# Patient Record
Sex: Female | Born: 1970 | Race: White | Hispanic: No | Marital: Married | Smoking: Never smoker
Health system: Southern US, Community
[De-identification: ages and names within clinical notes are randomized; demographics above are authoritative.]

## PROBLEM LIST (undated history)

## (undated) DIAGNOSIS — E669 Obesity, unspecified: Secondary | ICD-10-CM

## (undated) DIAGNOSIS — E785 Hyperlipidemia, unspecified: Secondary | ICD-10-CM

## (undated) DIAGNOSIS — I10 Essential (primary) hypertension: Secondary | ICD-10-CM

## (undated) HISTORY — DX: Essential (primary) hypertension: I10

## (undated) HISTORY — DX: Hyperlipidemia, unspecified: E78.5

## (undated) HISTORY — PX: OTHER SURGICAL HISTORY: SHX169

## (undated) HISTORY — DX: Obesity, unspecified: E66.9

---

## 1998-06-04 ENCOUNTER — Other Ambulatory Visit: Admission: RE | Admit: 1998-06-04 | Discharge: 1998-06-04 | Payer: Self-pay | Admitting: Obstetrics and Gynecology

## 1998-11-10 ENCOUNTER — Inpatient Hospital Stay (HOSPITAL_COMMUNITY): Admission: AD | Admit: 1998-11-10 | Discharge: 1998-11-12 | Payer: Self-pay | Admitting: Obstetrics and Gynecology

## 1998-12-14 ENCOUNTER — Other Ambulatory Visit: Admission: RE | Admit: 1998-12-14 | Discharge: 1998-12-14 | Payer: Self-pay | Admitting: Obstetrics and Gynecology

## 2004-05-28 ENCOUNTER — Other Ambulatory Visit: Admission: RE | Admit: 2004-05-28 | Discharge: 2004-05-28 | Payer: Self-pay | Admitting: Obstetrics and Gynecology

## 2004-08-11 ENCOUNTER — Ambulatory Visit: Payer: Self-pay | Admitting: Cardiology

## 2004-09-30 ENCOUNTER — Encounter: Admission: RE | Admit: 2004-09-30 | Discharge: 2004-12-29 | Payer: Self-pay | Admitting: Cardiology

## 2005-01-10 ENCOUNTER — Ambulatory Visit: Payer: Self-pay | Admitting: Cardiology

## 2005-03-01 ENCOUNTER — Ambulatory Visit: Payer: Self-pay | Admitting: Cardiology

## 2005-06-20 ENCOUNTER — Other Ambulatory Visit: Admission: RE | Admit: 2005-06-20 | Discharge: 2005-06-20 | Payer: Self-pay | Admitting: Obstetrics and Gynecology

## 2006-11-02 ENCOUNTER — Ambulatory Visit: Payer: Self-pay | Admitting: Cardiology

## 2009-01-07 ENCOUNTER — Encounter: Payer: Self-pay | Admitting: Cardiology

## 2009-04-11 DIAGNOSIS — E669 Obesity, unspecified: Secondary | ICD-10-CM | POA: Insufficient documentation

## 2009-04-11 DIAGNOSIS — E785 Hyperlipidemia, unspecified: Secondary | ICD-10-CM | POA: Insufficient documentation

## 2009-04-20 ENCOUNTER — Encounter (INDEPENDENT_AMBULATORY_CARE_PROVIDER_SITE_OTHER): Payer: Self-pay | Admitting: *Deleted

## 2009-09-03 ENCOUNTER — Telehealth: Payer: Self-pay | Admitting: Cardiology

## 2009-09-23 DIAGNOSIS — I10 Essential (primary) hypertension: Secondary | ICD-10-CM | POA: Insufficient documentation

## 2009-10-02 ENCOUNTER — Encounter (INDEPENDENT_AMBULATORY_CARE_PROVIDER_SITE_OTHER): Payer: Self-pay | Admitting: *Deleted

## 2009-12-07 ENCOUNTER — Ambulatory Visit: Payer: Self-pay | Admitting: Cardiology

## 2010-02-15 ENCOUNTER — Ambulatory Visit: Payer: Self-pay | Admitting: Diagnostic Radiology

## 2010-02-15 ENCOUNTER — Emergency Department (HOSPITAL_BASED_OUTPATIENT_CLINIC_OR_DEPARTMENT_OTHER): Admission: EM | Admit: 2010-02-15 | Discharge: 2010-02-15 | Payer: Self-pay | Admitting: Emergency Medicine

## 2010-10-12 NOTE — Assessment & Plan Note (Signed)
Summary: ROV PER PT CALL/JSS      Allergies Added: NKDA  Visit Type:  rov Primary Provider:  Dr. Jannet Mantis   History of Present Illness: Danielle Newton returns today for evaluation and management of her hypertension, obesity, and mild hyperlipidemia.  Her weight has gone up a few pounds. The last time I saw her was in 2008. She is walking 3 hours a week with her children. She takes her medication reliably.  She's having no angina or ischemic symptoms. Denies any polyuria polydipsia or weight loss.  Problems Prior to Update: 1)  Hypertension  (ICD-401.9) 2)  Hyperlipidemia, Mild  (ICD-272.4) 3)  Obesity  (ICD-278.00)  Current Medications (verified): 1)  Diovan Hct 80-12.5 Mg Tabs (Valsartan-Hydrochlorothiazide) .Marland Kitchen.. 1 Tab Once Daily  Allergies (verified): No Known Drug Allergies  Past History:  Past Medical History: Last updated: 09/23/2009 HYPERTENSION (ICD-401.9) HYPERLIPIDEMIA, MILD (ICD-272.4) OBESITY (ICD-278.00)      Past Surgical History: Last updated: 04/11/2009 Vaginal delivery 1998/2000  Family History: Last updated: 04/11/2009 Family History of Hypertension: Father HTN  Social History: Last updated: 04/11/2009 Married  Drug Use - no  Review of Systems       negative other history of present illness  Vital Signs:  Patient profile:   40 year old female Height:      61 inches Weight:      209 pounds BMI:     39.63 Pulse rate:   85 / minute BP sitting:   141 / 86  (left arm) Cuff size:   large  Vitals Entered By: Danielle Newton (December 07, 2009 9:29 AM)  Physical Exam  General:  obese.   Head:  normocephalic and atraumatic Eyes:  PERRLA/EOM intact; conjunctiva and lids normal. Neck:  Neck supple, no JVD. No masses, thyromegaly or abnormal cervical nodes. Chest Jerome Otter:  no deformities or breast masses noted Lungs:  Clear bilaterally to auscultation and percussion. Heart:  Non-displaced PMI, chest non-tender; regular rate and rhythm, S1, S2  without murmurs, rubs or gallops. Carotid upstroke normal, no bruit. Normal abdominal aortic size, no bruits. Femorals normal pulses, no bruits. Pedals normal pulses. No edema, no varicosities. Msk:  Back normal, normal gait. Muscle strength and tone normal. Pulses:  pulses normal in all 4 extremities Extremities:  No clubbing or cyanosis. Neurologic:  Alert and oriented x 3.   Impression & Recommendations:  Problem # 1:  HYPERTENSION (ICD-401.9) Assessment Unchanged Her blood pressures are running 130/80. She is exercising on a regular basis. Have encouraged her to hold her weight stable order to lose. Otherwise no change. Her updated medication list for this problem includes:    Diovan Hct 80-12.5 Mg Tabs (Valsartan-hydrochlorothiazide) .Marland Kitchen... 1 tab once daily  Problem # 2:  OBESITY (ICD-278.00) Assessment: Deteriorated  Problem # 3:  HYPERLIPIDEMIA, MILD (ICD-272.4) Assessment: Unchanged Annual fasting lipids and blood sugar with Dr. Henderson Cloud.  Patient Instructions: 1)  Your physician recommends that you schedule a follow-up appointment in: YEAR WITH DR Warner Laduca 2)  Your physician recommends that you continue on your current medications as directed. Please refer to the Current Medication list given to you today.

## 2010-10-12 NOTE — Letter (Signed)
Summary: Appointment - Missed  [facility]     [place], [place]    Phone:   Fax:      October 02, 2009 MRN: [id number]   Danielle Newton [address] [place], [place]  [address]   Dear [name],  Our records indicate you missed your appointment on 10/02/2009 with  Dr. Daleen Squibb   It is very important that we reach you to reschedule this appointment. We look forward to participating in your health care needs. Please contact us at the number listed above at your earliest convenience to reschedule this appointment.     Sincerely,   Lorne Skeens  [facility] Scheduling Team

## 2010-10-28 IMAGING — CR DG CHEST 2V
2 series · 2 of 2 positions shown · non-contrast
Comparison: No prior studies.

CLINICAL DATA: Chest pain and shortness of breath.

CHEST - 2 VIEW

[w chest pa]
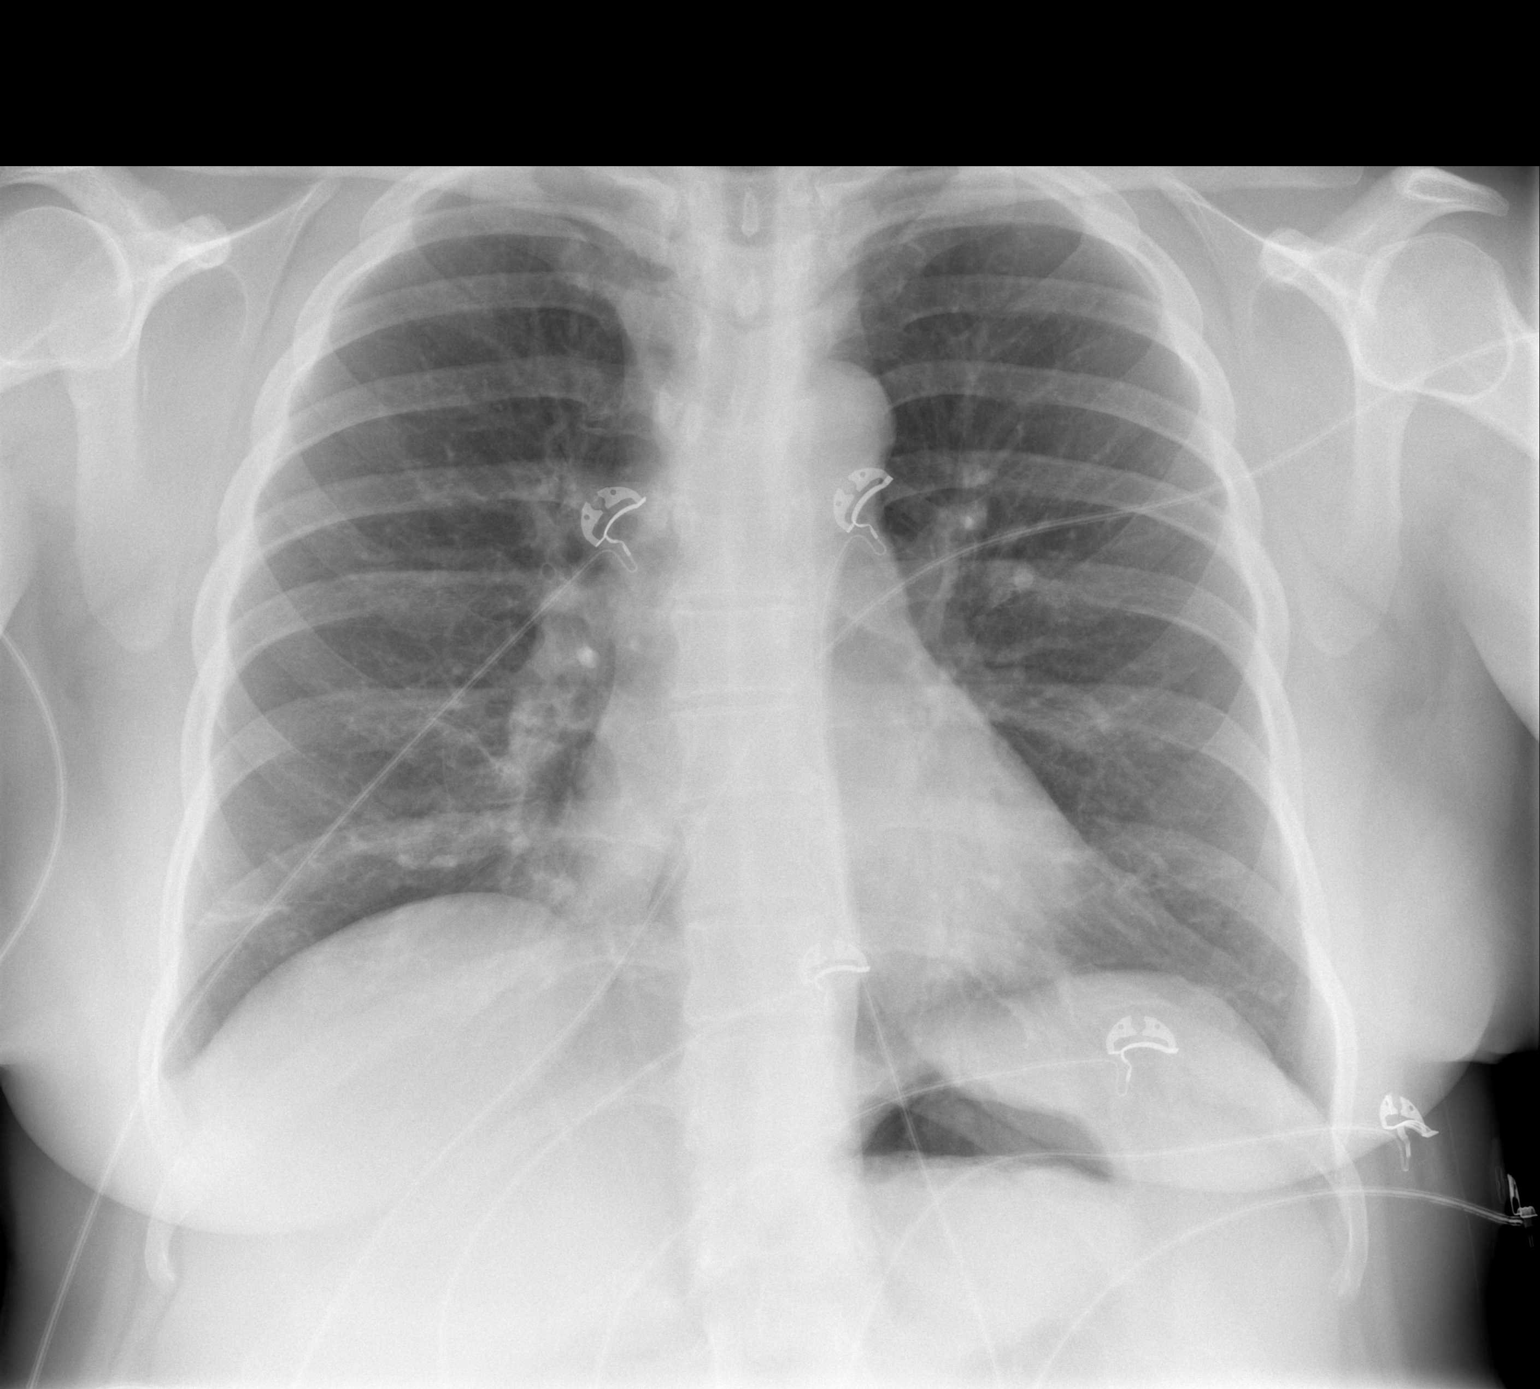

[w chest lat]
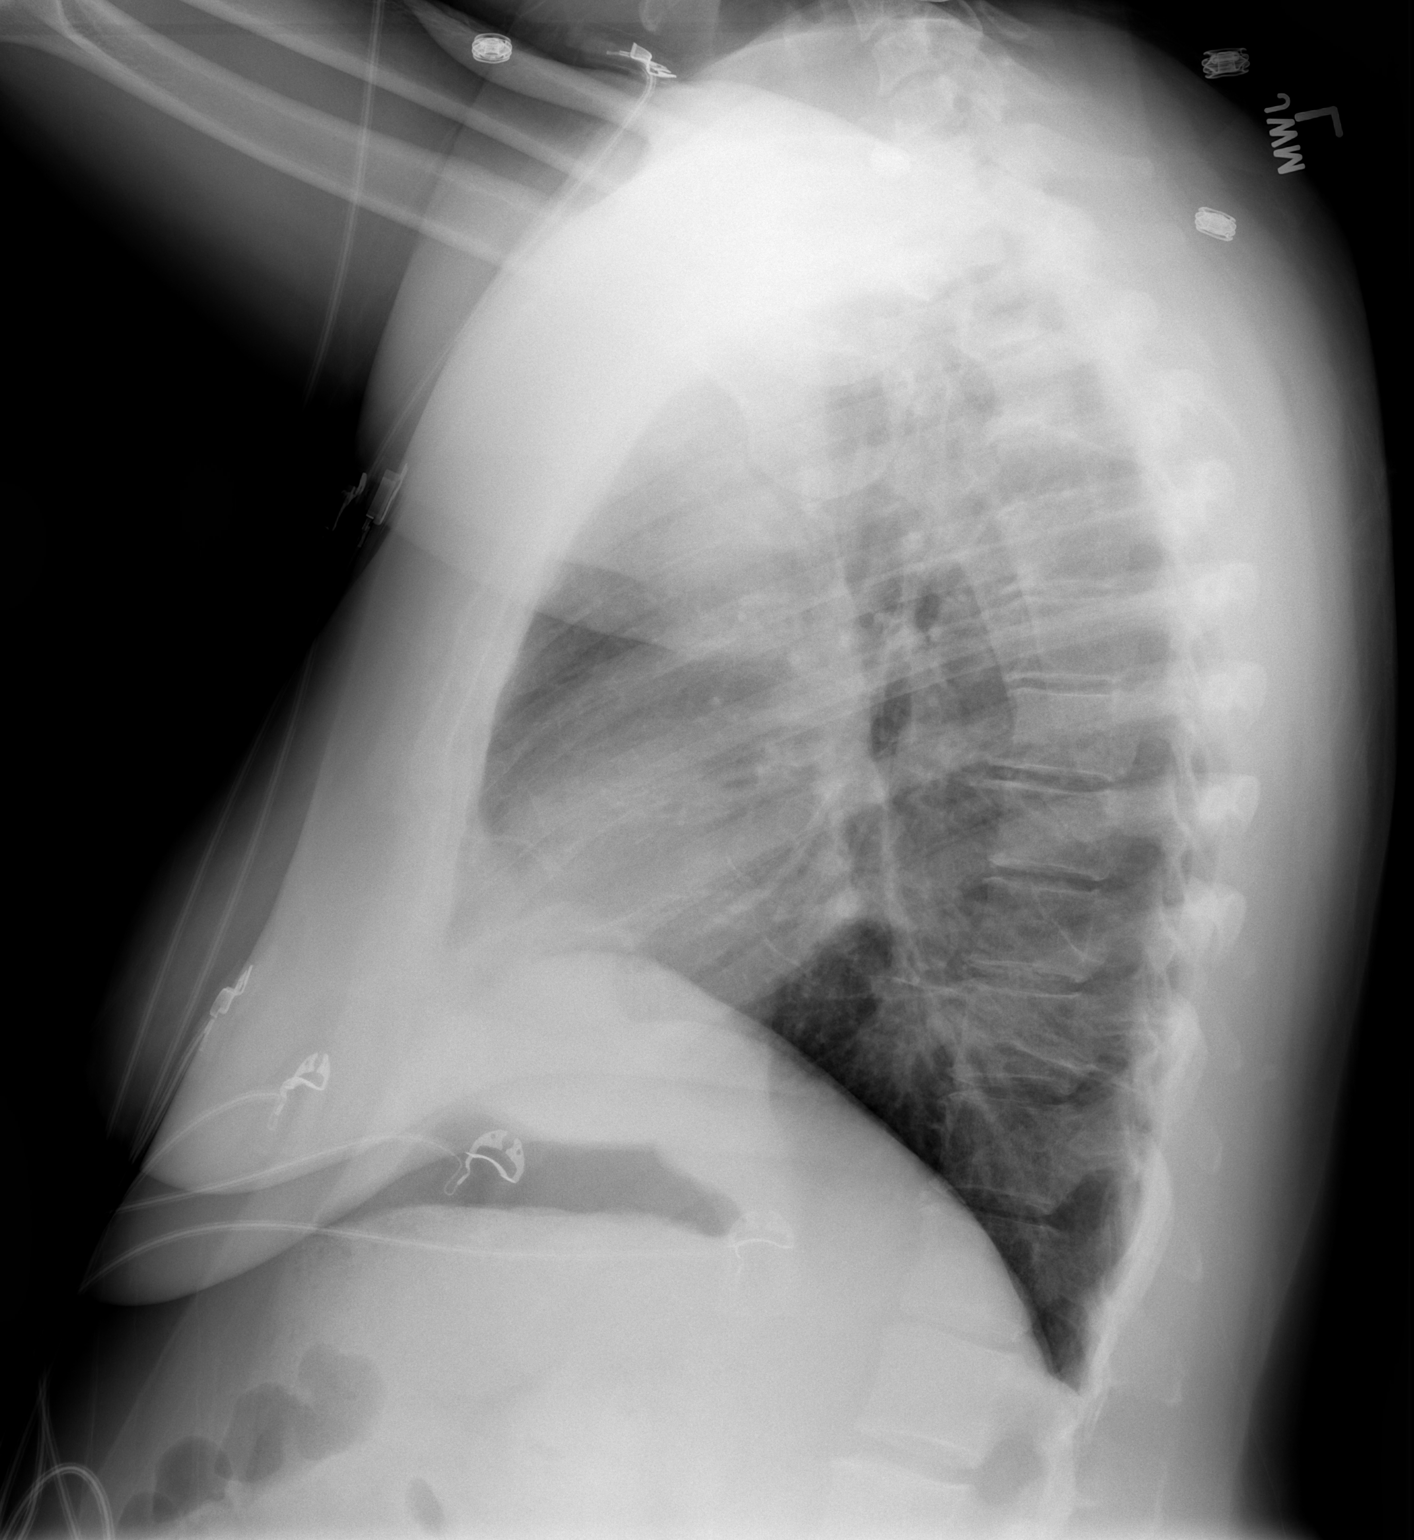

[2 of 2 positions shown; findings below may reference images not displayed]

FINDINGS: Minor linear atelectasis/scarring is noted with the
anterior portion of the right middle lobe and within the right
lower lobe.  There are no infiltrates. The heart and mediastinal
structures have a normal appearance.
IMPRESSION: Minor linear scarring within the right lung base.  Otherwise,
negative study.

## 2010-10-28 IMAGING — CT CT ANGIO CHEST
2 of 6 series · 19 of 36 positions shown · IV contrast (APPLIED)
Comparison: Chest radiographs obtained earlier today.

CLINICAL DATA: Sudden shortness of breath today.  Started on
antibiotics for pneumonia yesterday.

CT ANGIOGRAPHY CHEST WITH CONTRAST
TECHNIQUE: Multidetector CT imaging of the chest was performed
using the standard protocol during bolus administration of
intravenous contrast.  Multiplanar CT image reconstructions
including MIPs were obtained to evaluate the vascular anatomy.
Contrast:  80 ml Omnipaque 350

[Series 5: pe 1.0 b25f · axial · 0.63mm/px · z∈[-242,-27]mm · 18 of 239 slices shown]
[im 12/239  lung]
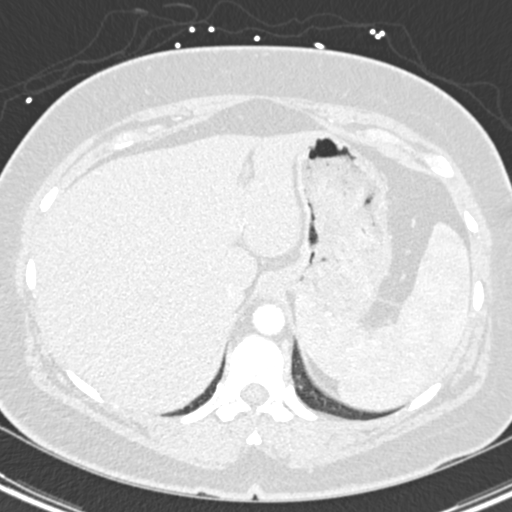
[im 24/239  mediastinal]
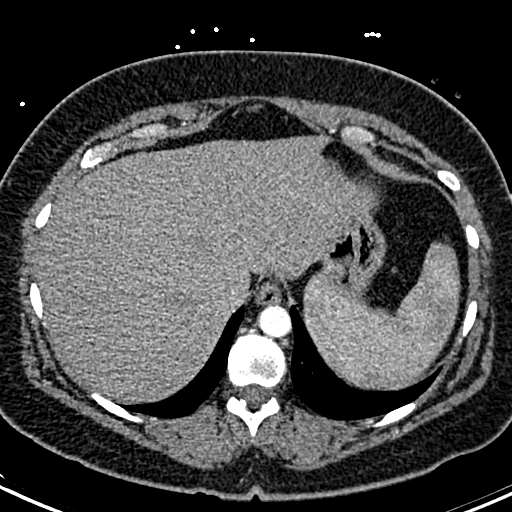
[im 36/239  lung]
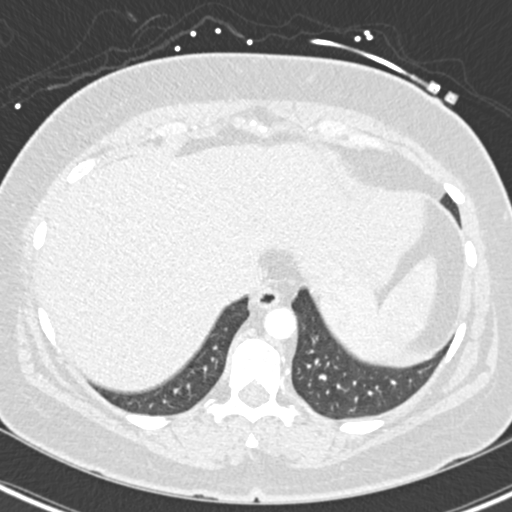
[im 48/239  mediastinal]
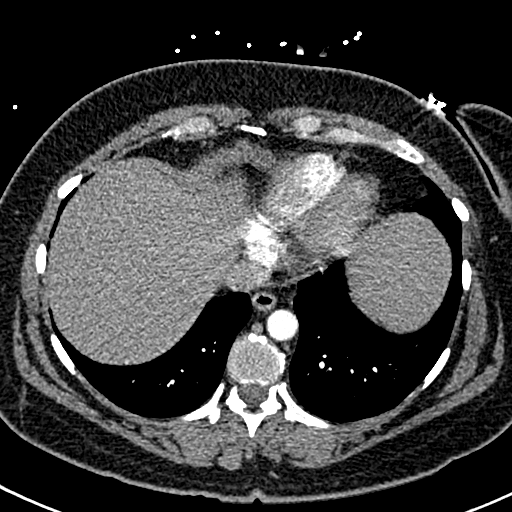
[im 60/239  lung]
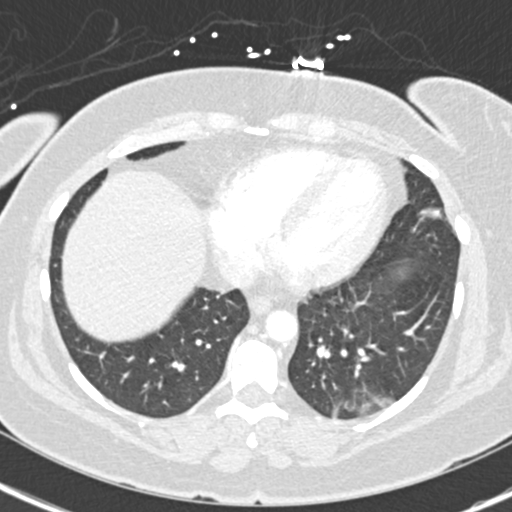
[im 72/239  mediastinal]
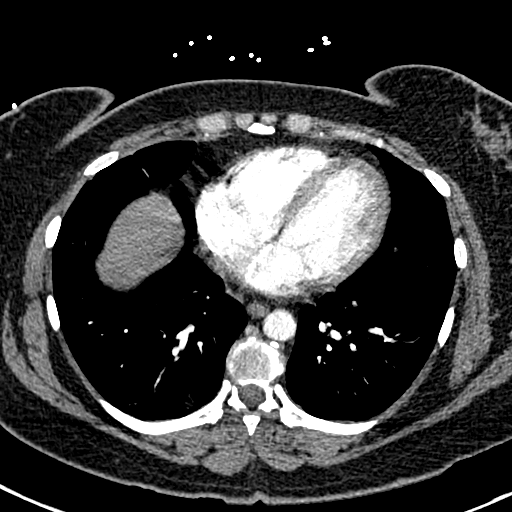
[im 84/239  lung]
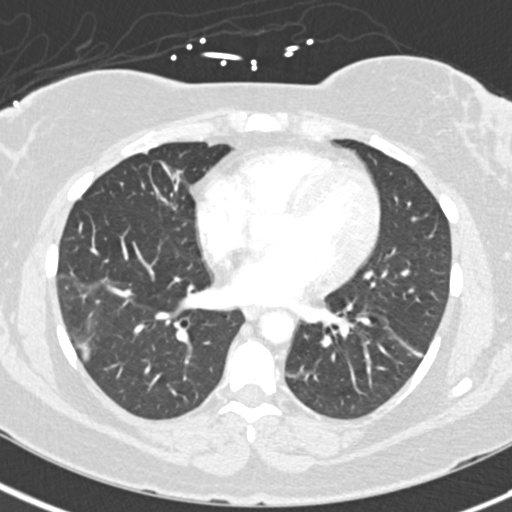
[im 96/239  mediastinal]
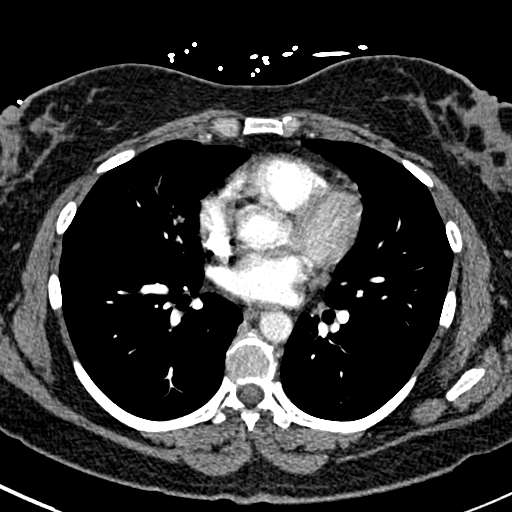
[im 108/239  lung]
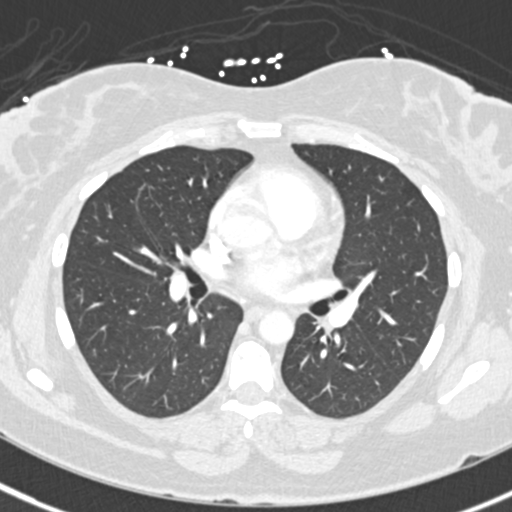
[im 131/239  mediastinal]
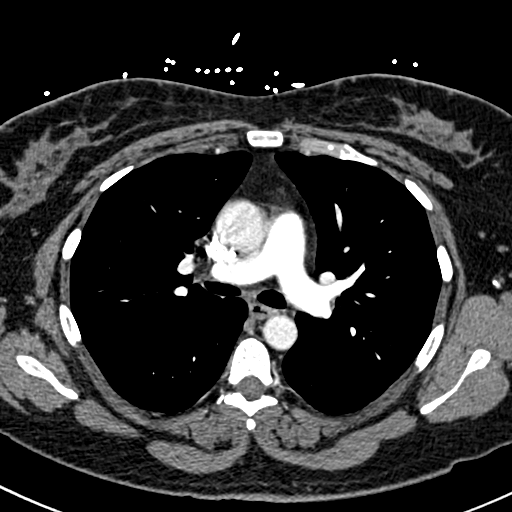
[im 143/239  lung]
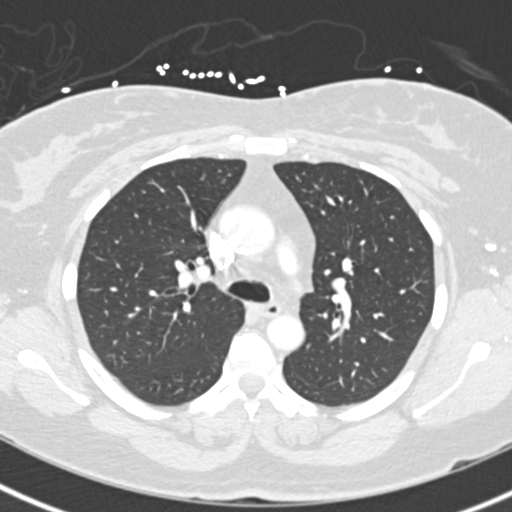
[im 155/239  mediastinal]
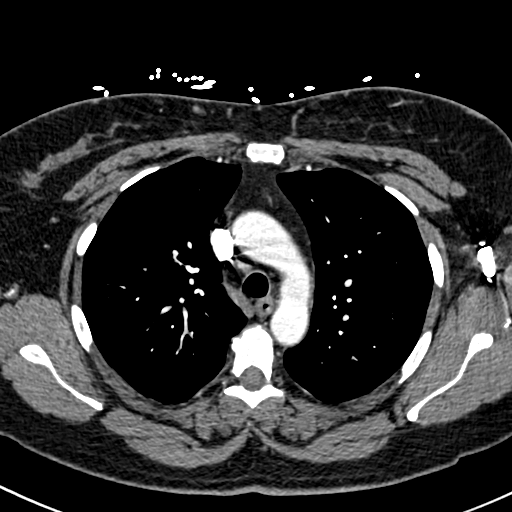
[im 167/239  lung]
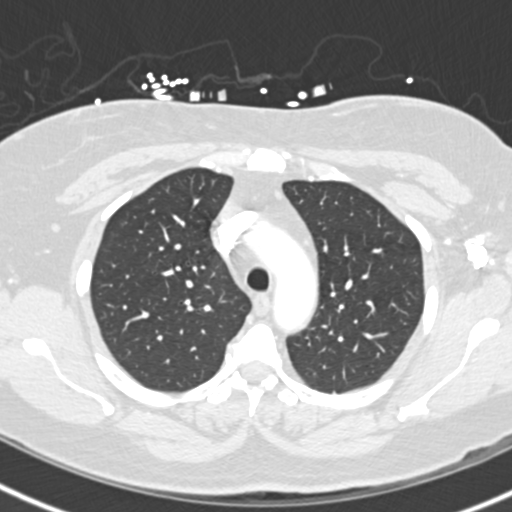
[im 179/239  mediastinal]
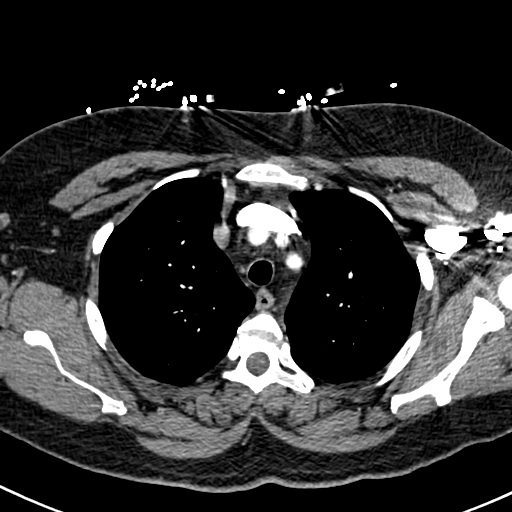
[im 191/239  lung]
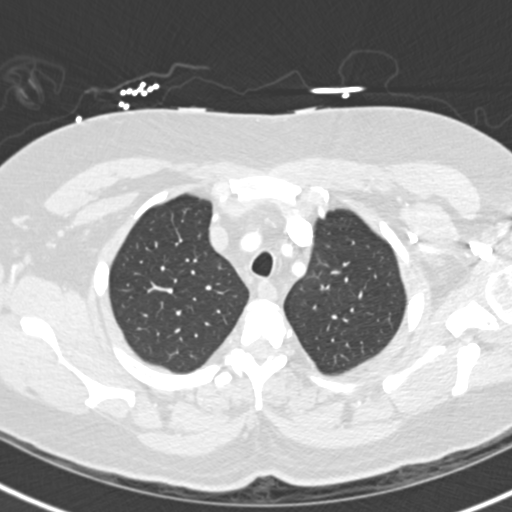
[im 203/239  mediastinal]
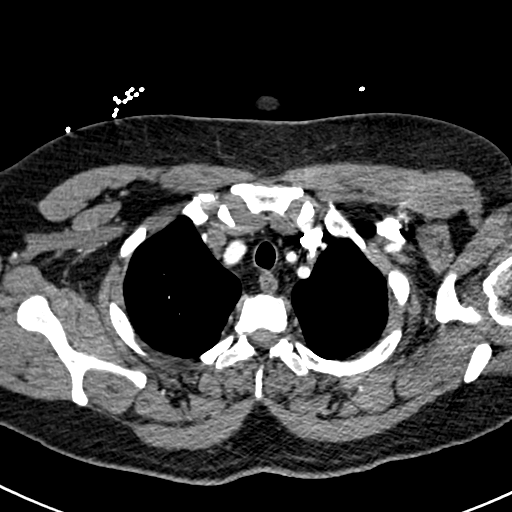
[im 215/239  lung]
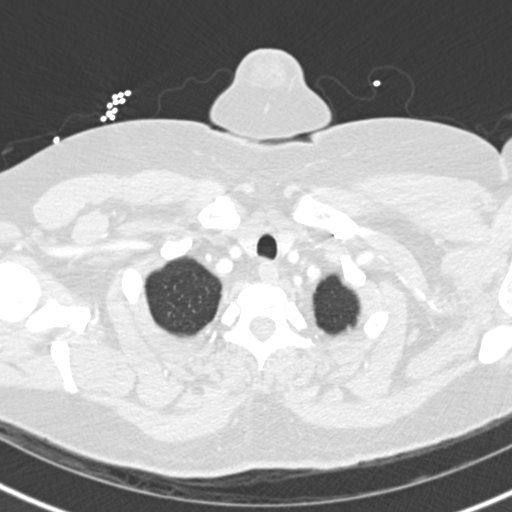
[im 227/239  mediastinal]
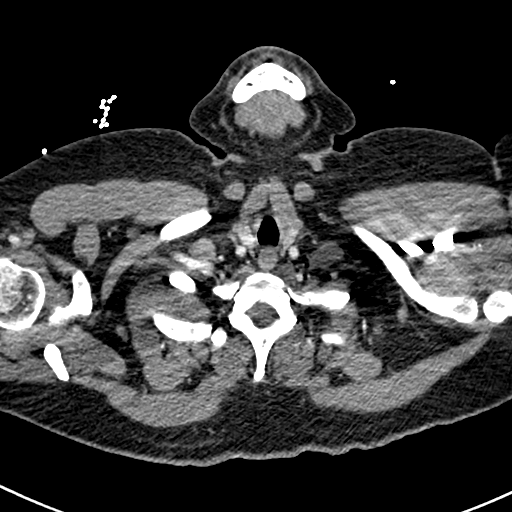

[Series 8: pe 2.0 coronal · coronal · 0.47mm/px · 1 of 138 slices shown]
[im 69/138  mediastinal]
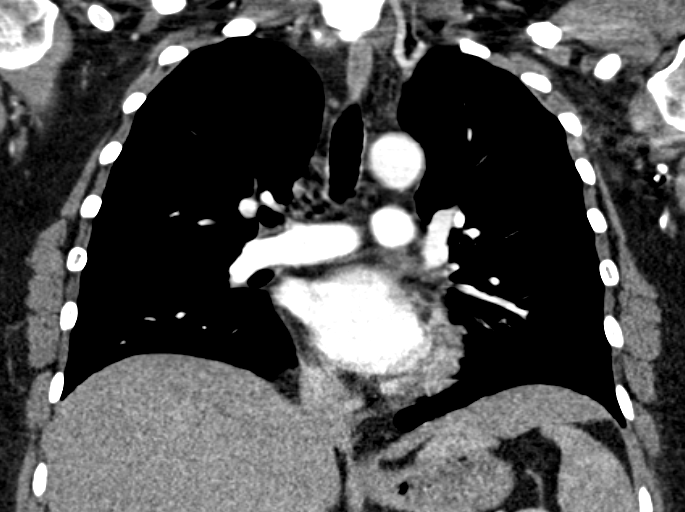

[19 of 36 positions shown; findings below may reference images not displayed]

FINDINGS: Normally opacified pulmonary arteries with no pulmonary
arterial filling defects seen.  Small amount of linear density in
both lower lobes, the lingula and right middle lobe.  Three tiny
nodular densities in the right lung.  No enlarged lymph nodes and
no airspace consolidation.  The included portions of the upper
abdomen are unremarkable.  Thoracic spine degenerative changes are
noted.

Review of the MIP images confirms the above findings.
IMPRESSION: 1.  No pulmonary emboli.
2.  Small amount of bilateral lower lobe, lingular and right middle
lobe linear atelectasis or scarring.
3.  Three tiny right lung nodules.  One of these appears partially
calcified and these are most likely related to previous
granulomatous infection.

## 2010-11-29 LAB — DIFFERENTIAL
Eosinophils Relative: 1 % (ref 0–5)
Lymphocytes Relative: 9 % — ABNORMAL LOW (ref 12–46)
Monocytes Absolute: 0.2 10*3/uL (ref 0.1–1.0)
Neutrophils Relative %: 87 % — ABNORMAL HIGH (ref 43–77)

## 2010-11-29 LAB — POCT CARDIAC MARKERS
CKMB, poc: <1 ng/mL — ABNORMAL LOW (ref 1.0–8.0)
Troponin i, poc: <0.05 ng/mL (ref 0.00–0.09)
Troponin i, poc: <0.05 ng/mL (ref 0.00–0.09)

## 2010-11-29 LAB — URINALYSIS, ROUTINE W REFLEX MICROSCOPIC
Bilirubin Urine: NEGATIVE
Glucose, UA: NEGATIVE mg/dL
Ketones, ur: NEGATIVE mg/dL
Urobilinogen, UA: 0.2 mg/dL (ref 0.0–1.0)

## 2010-11-29 LAB — BASIC METABOLIC PANEL
BUN: 13 mg/dL (ref 6–23)
CO2: 24 mEq/L (ref 19–32)
GFR calc Af Amer: >60 mL/min (ref >60)
Glucose, Bld: 197 mg/dL — ABNORMAL HIGH (ref 70–99)
Potassium: 3.7 mEq/L (ref 3.5–5.1)
Sodium: 142 mEq/L (ref 135–145)

## 2010-11-29 LAB — D-DIMER, QUANTITATIVE: D-Dimer, Quant: 0.51 ug/mL-FEU — ABNORMAL HIGH (ref 0.00–0.48)

## 2010-11-29 LAB — CBC
HCT: 38.4 % (ref 36.0–46.0)
Hemoglobin: 12.7 g/dL (ref 12.0–15.0)
MCV: 81.6 fL (ref 78.0–100.0)
WBC: 10.3 10*3/uL (ref 4.0–10.5)

## 2010-11-29 LAB — URINE MICROSCOPIC-ADD ON

## 2010-11-29 LAB — PREGNANCY, URINE: Preg Test, Ur: NEGATIVE

## 2011-01-07 ENCOUNTER — Other Ambulatory Visit: Payer: Self-pay | Admitting: *Deleted

## 2011-01-07 MED ORDER — VALSARTAN-HYDROCHLOROTHIAZIDE 80-12.5 MG PO TABS: 1.0000 | ORAL_TABLET | Freq: Every day | ORAL | Status: DC

## 2011-01-28 NOTE — Assessment & Plan Note (Signed)
[facility]                            CARDIOLOGY OFFICE NOTE   CAMDYNN, MARANTO                      MRN:          [id number]  DATE:11/02/2006                            DOB:          10-26-1970    Ms. Hrivnak returns today for further management for the following  issues:  1. Hypertension.  2. Obesity.  3. Sedentary lifestyle.  4. Mild hyperlipidemia.   I saw her last in 2005.  At that time her total cholesterol was 204,  triglycerides were 173, HDL was 58, LDL was 111.  Her blood pressure was  elevated at the time at 138/96.  It was elevated at Dr. Huel Coventry office  as well.  We started her on Diovan HCT 80/12.5 every day.  This has done  very well for her.  She is still not walking on a regular basis.  She  has not lost any weight.  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  She is  due followup blood work with Dr. Henderson Cloud in April.   PHYSICAL EXAMINATION:  VITAL SIGNS:  Her blood pressure today is 116/78.  Her pulse is 89 and regular.  EKG is normal.  Weight is 204, which is up  another 10 pounds.  HEENT:  Normocephalic atraumatic.  PERRLA.  Extraocular movements  intact.  Sclerae are clear.  Facial asymmetry is normal.  Dentition is  satisfactory.  NECK:  Carotid upstrokes are equal bilaterally without bruits.  There is  no JVD.  Thyroid is not enlarged.  Trachea is midline.  LUNGS:  Clear.  HEART:  Reveals a nondisplaced PMI.  She has normal S1 S2.  There is no  murmur, rub, or gallop.  ABDOMEN:  Soft, good bowel sounds.  Organomegaly can not be assessed.  EXTREMITIES:  Reveal trace edema.  Pulses are intact.  NEUROLOGIC:  Intact.  SKIN:  Intact.   ASSESSMENT:  1. Essential hypertension, under good control with Diovan HCT.  2. Obesity.  3. Sedentary lifestyle.  4. Need for repeat lipid status.   PLAN:  1. I renewed her Diovan HCT  for a year.  2. Three hours of walking per week.  3. Try to loose some weight.  4. Have lipids drawn  with Dr. Henderson Cloud in April.  If her LDL is greater      than 130, I would like to see her back.     Thomas C. Daleen Squibb, MD, [facility]  Electronically Signed    TCW/MedQ  DD: 11/02/2006  DT: 11/03/2006  Job #: [id number]   cc:   Guy Sandifer. Henderson Cloud, M.D.

## 2011-11-25 ENCOUNTER — Other Ambulatory Visit: Payer: Self-pay

## 2011-11-25 MED ORDER — VALSARTAN-HYDROCHLOROTHIAZIDE 80-12.5 MG PO TABS: 1.0000 | ORAL_TABLET | Freq: Every day | ORAL | Status: DC

## 2011-12-02 ENCOUNTER — Telehealth: Payer: Self-pay | Admitting: *Deleted

## 2011-12-02 NOTE — Telephone Encounter (Signed)
LMTCB to schedule yearly appt ( last ov 11/2009). Mylo Red RN

## 2011-12-14 ENCOUNTER — Encounter: Payer: Self-pay | Admitting: *Deleted

## 2011-12-14 ENCOUNTER — Ambulatory Visit: Payer: Self-pay | Admitting: Cardiology

## 2012-08-02 ENCOUNTER — Encounter (HOSPITAL_COMMUNITY): Payer: Self-pay | Admitting: *Deleted

## 2012-08-02 ENCOUNTER — Inpatient Hospital Stay (HOSPITAL_COMMUNITY)
Admission: AD | Admit: 2012-08-02 | Discharge: 2012-08-02 | Disposition: A | Discharge status: Home / Self Care | Payer: 59 | Source: Ambulatory Visit | Attending: Obstetrics and Gynecology | Admitting: Obstetrics and Gynecology

## 2012-08-02 DIAGNOSIS — N10 Acute tubulo-interstitial nephritis: Secondary | ICD-10-CM | POA: Diagnosis present

## 2012-08-02 DIAGNOSIS — R509 Fever, unspecified: Secondary | ICD-10-CM | POA: Diagnosis present

## 2012-08-02 LAB — URINE MICROSCOPIC-ADD ON

## 2012-08-02 LAB — URINALYSIS, ROUTINE W REFLEX MICROSCOPIC
Bilirubin Urine: NEGATIVE
Protein, ur: 100 mg/dL — AB
Specific Gravity, Urine: 1.02 (ref 1.005–1.030)

## 2012-08-02 LAB — CBC
HCT: 36.4 % (ref 36.0–46.0)
RDW: 13.8 % (ref 11.5–15.5)

## 2012-08-02 MED ORDER — SODIUM CHLORIDE 0.9 % IV BOLUS (SEPSIS): 500.0000 mL | INTRAVENOUS | Status: DC

## 2012-08-02 MED ORDER — CIPROFLOXACIN HCL 500 MG PO TABS
500.0000 mg | ORAL_TABLET | ORAL | Status: AC
  Administered 2012-08-02: 500 mg via ORAL
  Filled 2012-08-02: qty 1

## 2012-08-02 MED ORDER — CIPROFLOXACIN HCL 500 MG PO TABS: 500.0000 mg | ORAL_TABLET | Freq: Two times a day (BID) | ORAL | Status: DC

## 2012-08-02 MED ORDER — DEXTROSE 5 % IV SOLN
2.0000 g | INTRAVENOUS | Status: AC
  Administered 2012-08-02: 2 g via INTRAVENOUS
  Filled 2012-08-02: qty 2

## 2012-08-02 MED ORDER — LACTATED RINGERS IV BOLUS (SEPSIS): 500.0000 mL | Freq: Once | INTRAVENOUS | Status: DC

## 2012-08-02 MED ORDER — ACETAMINOPHEN 500 MG PO TABS
1000.0000 mg | ORAL_TABLET | ORAL | Status: AC
  Administered 2012-08-02 (×2): 1000 mg via ORAL
  Filled 2012-08-02: qty 2

## 2012-08-02 NOTE — MAU Note (Signed)
Pt here for fever since Tuesday, denies vaginal discharge or bleeding. Notes headache. Has vomited phlegm x3 today, otherwise none to report. Temp up and down at home, as high as 102.5. Was dx'd with uti, given abx over the weekend, then s/s began after taking abx. Denies uti s/s.

## 2012-08-02 NOTE — MAU Provider Note (Signed)
Chief Complaint: Fever   First Provider Initiated Contact with Patient 08/02/12 1635     SUBJECTIVE HPI: Danielle Newton is a 41 y.o. G2P2 at Unknown by LMP who presents to maternity admissions reporting fever/chills x2 days.  She was treated for UTI 5-6 days ago and is still completing her course of Macrobid.  Two days after starting PO abx, pt reports feeling right flank pain and feverish.  She was seen in the office by Dr Henderson Cloud at that time and he reported her culture results were negative for infection so to continue her course of abx.  Today her fever worsened and was as high as 102 at home and she has had nausea with some slight emesis of mostly phlegm per pt. She has kept down some food and fluids today.  She denies vaginal bleeding, vaginal itching/burning, urinary symptoms, flank pain, h/a, or dizziness.    Past Medical History  Diagnosis Date  . Other and unspecified hyperlipidemia   . Unspecified essential hypertension   . Obesity, unspecified    Past Surgical History  Procedure Date  . Vaginal delivery 1998, 2000  . Incision and drainage left breast    History   Social History  . Marital Status: Married    Spouse Name: N/A    Number of Children: N/A  . Years of Education: N/A   Occupational History  . Not on file.   Social History Main Topics  . Smoking status: Never Smoker   . Smokeless tobacco: Never Used  . Alcohol Use: No  . Drug Use: No  . Sexually Active: Not on file   Other Topics Concern  . Not on file   Social History Narrative  . No narrative on file   No current facility-administered medications on file prior to encounter.   Current Outpatient Prescriptions on File Prior to Encounter  Medication Sig Dispense Refill  . valsartan-hydrochlorothiazide (DIOVAN HCT) 80-12.5 MG per tablet Take 1 tablet by mouth daily.  30 tablet  11   No Known Allergies  ROS: Pertinent items in HPI  OBJECTIVE Blood pressure 133/81, pulse 117, temperature 102.3 F  (39.1 C), temperature source Oral, resp. rate 20, height 5\' 1"  (1.549 m), weight 95.879 kg (211 lb 6 oz), SpO2 97.00%.  Temp down to 99.6 following Tylenol dose  GENERAL: Well-developed, well-nourished female in no acute distress.  HEENT: Normocephalic Chest - clear to auscultation, no wheezes, rales or rhonchi, symmetric air entry Heart - normal rate, regular rhythm, normal S1, S2, no murmurs, rubs, clicks or gallops Abdomen - soft, nontender, nondistended, no masses or organomegaly no rebound tenderness noted, negative tenderness at McBurney's pt bowel sounds normal Back exam - full range of motion, no tenderness, palpable spasm or pain on motion, negative CVA tenderness Musculoskeletal - no joint tenderness, deformity or swelling, no muscular tenderness noted, full range of motion without pain Extremities - peripheral pulses normal, no pedal edema, no clubbing or cyanosis NEURO: Alert and oriented SPECULUM EXAM: Deferred  LAB RESULTS Results for orders placed during the hospital encounter of 08/02/12 (from the past 24 hour(s))  URINALYSIS, ROUTINE W REFLEX MICROSCOPIC     Status: Abnormal   Collection Time   08/02/12  4:09 PM      Component Value Range   Color, Urine YELLOW  YELLOW   APPearance CLOUDY (*) CLEAR   Specific Gravity, Urine 1.020  1.005 - 1.030   pH 8.5 (*) 5.0 - 8.0   Glucose, UA NEGATIVE  NEGATIVE mg/dL  Hgb urine dipstick MODERATE (*) NEGATIVE   Bilirubin Urine NEGATIVE  NEGATIVE   Ketones, ur 15 (*) NEGATIVE mg/dL   Protein, ur 161 (*) NEGATIVE mg/dL   Urobilinogen, UA 2.0 (*) 0.0 - 1.0 mg/dL   Nitrite NEGATIVE  NEGATIVE   Leukocytes, UA MODERATE (*) NEGATIVE  URINE MICROSCOPIC-ADD ON     Status: Abnormal   Collection Time   08/02/12  4:09 PM      Component Value Range   Squamous Epithelial / LPF RARE  RARE   WBC, UA TOO NUMEROUS TO COUNT  <3 WBC/hpf   RBC / HPF 3-6  <3 RBC/hpf   Bacteria, UA MANY (*) RARE  CBC     Status: Abnormal   Collection Time     08/02/12  6:10 PM      Component Value Range   WBC 14.1 (*) 4.0 - 10.5 K/uL   RBC 4.35  3.87 - 5.11 MIL/uL   Hemoglobin 11.9 (*) 12.0 - 15.0 g/dL   HCT 09.6  04.5 - 40.9 %   MCV 83.7  78.0 - 100.0 fL   MCH 27.4  26.0 - 34.0 pg   MCHC 32.7  30.0 - 36.0 g/dL   RDW 81.1  91.4 - 78.2 %   Platelets 165  150 - 400 K/uL   ASSESSMENT 1. Pyelonephritis, acute   2. Fever and chills     PLAN Called Dr Renaldo Fiddler to discuss assessment and findings Tylenol 1000 mg PO NS x500 ml Rocephin 2g IV Cipro 500 mg PO x1 dose Discharge home Cipro 500 mg BID x10 days F/U with Dr Henderson Cloud tomorrow Return to MAU as needed    Medication List     As of 08/02/2012  6:27 PM    ASK your doctor about these medications         ibuprofen 200 MG tablet   Commonly known as: ADVIL,MOTRIN   Take 400 mg by mouth every 6 (six) hours as needed. For headache      nitrofurantoin (macrocrystal-monohydrate) 100 MG capsule   Commonly known as: MACROBID   Take 100 mg by mouth every 12 (twelve) hours.      valsartan-hydrochlorothiazide 80-12.5 MG per tablet   Commonly known as: DIOVAN-HCT   Take 1 tablet by mouth daily.         Sharen Counter Certified Nurse-Midwife 08/02/2012  6:27 PM

## 2012-08-02 NOTE — MAU Note (Signed)
Patient was  Diagnosed with a UTI on Saturday 07/28/12 and started taking medication on Sunday11/17/13. Pt states she started having a fever Tuesday night11/19/13. Pt states she has also experienced nausea and vomiting 08/01/12 and today.

## 2012-08-04 LAB — URINE CULTURE

## 2013-02-02 ENCOUNTER — Other Ambulatory Visit: Payer: Self-pay | Admitting: Cardiology

## 2013-05-06 ENCOUNTER — Other Ambulatory Visit: Payer: Self-pay

## 2013-05-06 MED ORDER — VALSARTAN-HYDROCHLOROTHIAZIDE 80-12.5 MG PO TABS: ORAL_TABLET | ORAL | Status: DC

## 2013-06-14 ENCOUNTER — Ambulatory Visit: Payer: 59 | Admitting: Cardiovascular Disease

## 2013-06-25 ENCOUNTER — Encounter: Payer: Self-pay | Admitting: Cardiology

## 2013-06-25 ENCOUNTER — Ambulatory Visit (INDEPENDENT_AMBULATORY_CARE_PROVIDER_SITE_OTHER): Payer: 59 | Admitting: Cardiology

## 2013-06-25 VITALS — BP 134/80 | HR 80 | Ht 61.0 in | Wt 208.0 lb

## 2013-06-25 DIAGNOSIS — I10 Essential (primary) hypertension: Secondary | ICD-10-CM

## 2013-06-25 DIAGNOSIS — E785 Hyperlipidemia, unspecified: Secondary | ICD-10-CM

## 2013-06-25 DIAGNOSIS — E669 Obesity, unspecified: Secondary | ICD-10-CM

## 2013-06-25 NOTE — Progress Notes (Signed)
Patient ID: Danielle Newton, female   DOB: 05/31/1971, 42 y.o.   MRN: [id number] Referring MD: Dr. Henderson Cloud  42 y.o with history of HTN, hyperlipidemia, and newly-diagnosed diabetes presents for cardiology evaluation.  She was last seen over 3 years ago by Dr. Daleen Squibb.  She is on valsartan/HCT for BP control.  Her BP has been at goal.  She has continued to gain weight, and recent hemoglobin A1c was 6.9, suggesting type II diabetes.  She recently joined the [facility] and has been walking for about 45 minutes 3 times a week.  No exertional dyspnea, no chest pain, no palpitations or lightheadedness.  No recent lipid profile.   ECG: NSR, nonspecific inferior T wave flattening  PMH: 1. HTN 2. Obesity 3. Hyperlipidemia 4. 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   FH: Grandfather with MI (was elderly).   ROS: All systems reviewed and negative except as per HPI.   Current Outpatient Prescriptions  Medication Sig Dispense Refill  . acetaminophen (TYLENOL) 325 MG tablet Take 650 mg by mouth as needed for pain.      . valsartan-hydrochlorothiazide (DIOVAN-HCT) 80-12.5 MG per tablet TAKE 1 TABLET EVERY DAY  30 tablet  2   No current facility-administered medications for this visit.    BP 134/80  Pulse 80  Ht 5\' 1"  (1.549 m)  Wt 94.348 kg (208 lb)  BMI 39.32 kg/m2 General: NAD, obese.  Neck: No JVD, no thyromegaly or thyroid nodule.  Lungs: Clear to auscultation bilaterally with normal respiratory effort. CV: Nondisplaced PMI.  Heart regular S1/S2, no S3/S4, no murmur.  No peripheral edema.  No carotid bruit.  Normal pedal pulses.  Abdomen: Soft, nontender, no hepatosplenomegaly, no distention.  Skin: Intact without lesions or rashes.  Neurologic: Alert and oriented x 3.  Psych: Normal affect. Extremities: No clubbing or cyanosis.  HEENT: Normal.   Assessment/Plan: 1. HTN: BP is under good control, continue valsartan/HCT.  Abnormal ECG may be related  to HTN.  Check BMET.  2. Hyperlipidemia: I will arrange to check lipids.  3. Obesity: Patient needs aggressive diet and exercise, especially given new diabetes diagnosis.  She should try to exercise 5-6 times/week rather than 3 times a week.  We also talked about dietary changes.   Followup in 1 year.   Marca Ancona 06/25/2013

## 2013-06-25 NOTE — Patient Instructions (Signed)
Your physician recommends that you return for a FASTING lipid profile /BMET.  Your physician wants you to follow-up in: 1 year with Dr Shirlee Latch. (October 2015). You will receive a reminder letter in the mail two months in advance. If you don't receive a letter, please call our office to schedule the follow-up appointment.

## 2013-06-27 ENCOUNTER — Other Ambulatory Visit: Payer: Self-pay

## 2013-07-30 ENCOUNTER — Other Ambulatory Visit: Payer: Self-pay | Admitting: Cardiology

## 2014-07-14 ENCOUNTER — Encounter: Payer: Self-pay | Admitting: Cardiology

## 2014-07-19 ENCOUNTER — Other Ambulatory Visit: Payer: Self-pay | Admitting: Cardiology

## 2014-07-22 ENCOUNTER — Other Ambulatory Visit: Payer: Self-pay | Admitting: Cardiology

## 2014-08-25 ENCOUNTER — Other Ambulatory Visit: Payer: Self-pay | Admitting: Cardiology

## 2014-09-26 ENCOUNTER — Other Ambulatory Visit: Payer: Self-pay | Admitting: Cardiology

## 2015-10-08 ENCOUNTER — Other Ambulatory Visit: Payer: Self-pay | Admitting: Cardiology

## 2017-01-01 NOTE — Progress Notes (Signed)
 Assessment:   Bell's palsy, left  Not like central etiology Differential diagnosis explained to patient. All questions answered. .  predniSONE (DELTASONE) 20 MG tablet, Take 3 tablets (60 mg total) by mouth daily. for 7 days, Disp: 21 tablet, Rfl: 0 .  valACYclovir (VALTREX) 1000 MG tablet, Take 1 tablet (1,000 mg total) by mouth Three (3) times a day. for 7 days, Disp: 21 tablet, Rfl: 1 .  erythromycin (ROMYCIN) ophthalmic ointment, Administer into the left eye nightly., Disp: 3.5 g, Rfl: 0 Follow-up with me in 5days.  accelerated HTN restart valsartan -hydrochlorothiazide  (DIOVAN  HCT) 80-12.5 mg per tablet, Take 1 tablet by mouth daily., Disp: 30 tablet, Rfl: 11 Current Facility-Administered Medications:  .  cloNIDine HCl (CATAPRES) tablet 0.2 mg, 0.2 mg, Oral, Once, Maurine Planas, [facility] Patient will call her PCP on Monday and follow up with PCP.   Scalp psoriasis  .  coal tar (NEUTROGENA T-GEL) 0.5 % shampoo, Apply topically nightly as needed., Disp: 240 mL, Rfl: 12 .  erythromycin (ROMYCIN) ophthalmic ointment, Administer into the left eye nightly., Disp: 3.5 g, Rfl: 0 .  predniSONE (DELTASONE) 20 MG tablet, Take 3 tablets (60 mg total) by mouth daily. for 7 days, Disp: 21 tablet, Rfl: 0 Refer to dermatology    Subjective:    Left face weakness Danielle Newton is a 46 y.o. female who is here for left facial weakness for 5 days. Onset of symptoms was sudden, not related to any specific activity. She reports left mouth drooping, pressure in left ear, left face sagging, can not close left eye completely. She denies numbness, burning, lancinating pain, tingling, cramping, squeezing and hypersensitivity in face. No weakness in arm, legs.    accelerated HTN Has history of HTN for many years, was treated with valsartan -hydrochlorothiazide  (DIOVAN  HCT) 80-12.5 mg. She stopped the medication about 1 year ago as she wanted to try diet and exercise.  Her BP was 221/127 today, improved to 171/102  after 0.2 mg cloNIDine. Denies dyspnea, chest pain.   Scalp Rash Patchy erythematous rash along the posterior scalp. The rash has fine scales, slightly elevated, with sharp demarcation. No blisters. The rash is mild pruritic. No hair loss.   The following portions of the patient's history were reviewed and updated as appropriate: allergies, current medications, past family history, past medical history, past social history, past surgical history and problem list.  Review of Systems Review of Systems  Constitutional: Negative for chills and fever.  HENT: Negative for sore throat.   Eyes: Negative for blurred vision, double vision, photophobia, pain, discharge and redness.  Respiratory: Negative for cough, hemoptysis and shortness of breath.   Cardiovascular: Negative for chest pain, palpitations, orthopnea, claudication, leg swelling and PND.  Gastrointestinal: Negative for abdominal pain, diarrhea, heartburn, nausea and vomiting.  Genitourinary: Negative for dysuria, frequency and urgency.  Musculoskeletal: Negative for back pain, joint pain, myalgias and neck pain.  Skin: Positive for itching and rash.  Neurological: Positive for focal weakness. Negative for dizziness, tingling, tremors, sensory change, speech change, seizures, loss of consciousness and headaches.  Endo/Heme/Allergies: Negative for environmental allergies and polydipsia.  Psychiatric/Behavioral: Negative for depression.      Objective:  BP 171/102   Pulse 101   Temp 36.9 C (98.4 F) (Oral)   Resp 16   Ht 154.9 cm (5' 1)   Wt 91.2 kg (201 lb)   SpO2 98%   BMI 37.98 kg/m  BP Readings from Last 3 Encounters:  01/01/17 171/102  07/28/12 133/88  07/16/12  158/107   Physical Exam  Constitutional: She appears well-nourished.  HENT:  Head: Atraumatic.  Right Ear: External ear normal.  Left Ear: External ear normal.  Mouth/Throat: Oropharynx is clear and moist. No oropharyngeal exudate.  Eyes: Conjunctivae are  normal. No scleral icterus.  Cannot close left eye completely.   Neck: Neck supple.  Cardiovascular: Normal rate and regular rhythm.   Pulmonary/Chest: Effort normal and breath sounds normal. No respiratory distress.  Abdominal: Soft. There is no tenderness.  Musculoskeletal: She exhibits no edema or deformity.  Neurological: She is alert. She displays no atrophy and no tremor. A cranial nerve deficit is present. No sensory deficit. She exhibits normal muscle tone. She displays no seizure activity. Coordination and gait normal.  Left face weakness, mouth droop. Left eyelip droop  Skin:  See HPI  Psychiatric: She has a normal mood and affect.

## 2020-10-18 NOTE — Progress Notes (Deleted)
Cardiology Office Note:    Date:  10/18/2020   ID:  Danielle Newton, DOB 03-Aug-1971, MRN [id number]  PCP:  No primary care provider on file.  [facility] Cardiologist:  No primary care provider on file.  [facility] Electrophysiologist:  None   Referring MD: No ref. provider found    History of Present Illness:    Danielle Newton is a 50 y.o. female with a hx of HTN, hyperlipidemia, obesity, and DMII who was referred to clinic by her primary care for further evaluation of...  Last saw Dr. Shirlee Latch in 2014. Lost to follow-up.  Past Medical History:  Diagnosis Date  . Obesity, unspecified   . Other and unspecified hyperlipidemia   . Unspecified essential hypertension     Past Surgical History:  Procedure Laterality Date  . Incision and drainage left breast    . VAGINAL DELIVERY  1998, 2000    Current Medications: No outpatient medications have been marked as taking for the 10/21/20 encounter (Appointment) with Meriam Sprague, MD.     Allergies:   Patient has no known allergies.   Social History   Socioeconomic History  . Marital status: Married    Spouse name: Not on file  . Number of children: Not on file  . Years of education: Not on file  . Highest education level: Not on file  Occupational History  . Not on file  Tobacco Use  . Smoking status: Never Smoker  . Smokeless tobacco: Never Used  Substance and Sexual Activity  . Alcohol use: No  . Drug use: No  . Sexual activity: Not on file  Other Topics Concern  . Not on file  Social History Narrative  . Not on file   Social Determinants of Health   Financial Resource Strain: Not on file  Food Insecurity: Not on file  Transportation Needs: Not on file  Physical Activity: Not on file  Stress: Not on file  Social Connections: Not on file     Family History: The patient's ***family history includes Hypertension in her father.  ROS:   Please see the history of present illness.    *** All other  systems reviewed and are negative.  EKGs/Labs/Other Studies Reviewed:    The following studies were reviewed today: ***  EKG:  EKG is *** ordered today.  The ekg ordered today demonstrates ***  Recent Labs: No results found for requested labs within last 8760 hours.  Recent Lipid Panel No results found for: CHOL, TRIG, HDL, CHOLHDL, VLDL, LDLCALC, LDLDIRECT   Risk Assessment/Calculations:   {Does this patient have ATRIAL FIBRILLATION?:[phone]}   Physical Exam:    VS:  There were no vitals taken for this visit.    Wt Readings from Last 3 Encounters:  06/25/13 208 lb (94.3 kg)  08/02/12 211 lb 6 oz (95.9 kg)     GEN: *** Well nourished, well developed in no acute distress HEENT: Normal NECK: No JVD; No carotid bruits LYMPHATICS: No lymphadenopathy CARDIAC: ***RRR, no murmurs, rubs, gallops RESPIRATORY:  Clear to auscultation without rales, wheezing or rhonchi  ABDOMEN: Soft, non-tender, non-distended MUSCULOSKELETAL:  No edema; No deformity  SKIN: Warm and dry NEUROLOGIC:  Alert and oriented x 3 PSYCHIATRIC:  Normal affect   ASSESSMENT:    No diagnosis found. PLAN:    In order of problems listed above:  #HTN:  #HLD:  #Obesity:  #DMII:   {Are you ordering a CV Procedure (e.g. stress test, cath, DCCV, TEE, etc)?  Press F2        :[id number]}    Medication Adjustments/Labs and Tests Ordered: Current medicines are reviewed at length with the patient today.  Concerns regarding medicines are outlined above.  No orders of the defined types were placed in this encounter.  No orders of the defined types were placed in this encounter.   There are no Patient Instructions on file for this visit.   Signed, Meriam Sprague, MD  10/18/2020 1:48 PM    [facility]

## 2020-10-21 ENCOUNTER — Ambulatory Visit: Payer: Self-pay | Admitting: Cardiology

## 2022-06-09 ENCOUNTER — Ambulatory Visit
Admission: EM | Admit: 2022-06-09 | Discharge: 2022-06-09 | Disposition: A | Discharge status: Home / Self Care | Payer: BLUE CROSS/BLUE SHIELD

## 2022-06-09 ENCOUNTER — Ambulatory Visit: Payer: Self-pay

## 2022-06-09 DIAGNOSIS — N3000 Acute cystitis without hematuria: Secondary | ICD-10-CM | POA: Diagnosis not present

## 2022-06-09 LAB — POCT URINALYSIS DIP (MANUAL ENTRY)
Bilirubin, UA: NEGATIVE
Glucose, UA: 500 mg/dL — AB
Nitrite, UA: POSITIVE — AB
Protein Ur, POC: 100 mg/dL — AB
Spec Grav, UA: 1.02 (ref 1.010–1.025)
Urobilinogen, UA: 1 E.U./dL
pH, UA: 5 (ref 5.0–8.0)

## 2022-06-09 MED ORDER — NITROFURANTOIN MONOHYD MACRO 100 MG PO CAPS: 100.0000 mg | ORAL_CAPSULE | Freq: Two times a day (BID) | ORAL | 0 refills | Status: DC

## 2022-06-09 MED ORDER — PHENAZOPYRIDINE HCL 200 MG PO TABS: 200.0000 mg | ORAL_TABLET | Freq: Three times a day (TID) | ORAL | 0 refills | Status: DC

## 2022-06-09 NOTE — ED Triage Notes (Signed)
Patient presents to UC for urinary freq and dysuria since Tuesday. Treating with AZO.

## 2022-06-09 NOTE — Discharge Instructions (Addendum)
Your symptoms are consistent with a urinary tract infection. Start taking the antibiotic twice daily until completed. You can take Pyridium up to 3 times daily for maximum of 2 days, this is only to help with the frequency and the pain.  Take on an as-needed basis. Monitor for any change in or worsening symptoms; fever, hematuria or flank pain would warrant a recheck  Increase water intake.

## 2022-06-09 NOTE — ED Provider Notes (Signed)
[facility]    CSN: [id number] Arrival date & time: 06/09/22  1612      History   Chief Complaint Chief Complaint  Patient presents with   Dysuria   Urinary Frequency   Appointment    HPI Danielle Newton is a 51 y.o. female.   51 year old female presents today with a 2-day history of dysuria and urinary frequency.  Has a history of UTIs in the past and states this feels similar.  Denies flank pain, fever, hematuria, vaginal discharge.  Does feel pelvic pressure.  Has not taken any over-the-counter medications for symptoms.   Dysuria Urinary Frequency    Past Medical History:  Diagnosis Date   Obesity, unspecified    Other and unspecified hyperlipidemia    Unspecified essential hypertension     Patient Active Problem List   Diagnosis Date Noted   Pyelonephritis, acute 08/02/2012   Fever and chills 08/02/2012   HYPERTENSION 09/23/2009   HYPERLIPIDEMIA, MILD 04/11/2009   OBESITY 04/11/2009    Past Surgical History:  Procedure Laterality Date   Incision and drainage left breast     VAGINAL DELIVERY  1998, 2000    OB History     Gravida  2   Para  2   Term      Preterm      AB      Living  2      SAB      IAB      Ectopic      Multiple      Live Births               Home Medications      Family History Family History  Problem Relation Age of Onset   Hypertension Father     Social History Social History   Tobacco Use   Smoking status: Never   Smokeless tobacco: Never  Substance Use Topics   Alcohol use: No   Drug use: No     Allergies   Penicillins and Benzonatate   Review of Systems Review of Systems  Genitourinary:  Positive for dysuria and frequency.  As per HPI   Physical Exam Triage Vital Signs ED Triage Vitals  Enc Vitals Group     BP 06/09/22 1642 (!) 133/91     Pulse Rate 06/09/22 1642 100     Resp 06/09/22 1642 16     Temp 06/09/22 1642 98.3 F (36.8 C)     Temp Source 06/09/22  1642 Oral     SpO2 06/09/22 1642 98 %     Weight --      Height --      Head Circumference --      Peak Flow --      Pain Score 06/09/22 1639 0     Pain Loc --      Pain Edu? --      Excl. in [place]? --    No data found.  Updated Vital Signs BP (!) 133/91 (BP Location: Right Arm)   Pulse 100   Temp 98.3 F (36.8 C) (Oral)   Resp 16   SpO2 98%   Visual Acuity Right Eye Distance:   Left Eye Distance:   Bilateral Distance:    Right Eye Near:   Left Eye Near:    Bilateral Near:     Physical Exam Vitals and nursing note reviewed.  Constitutional:      General: She is not in acute distress.  Appearance: Normal appearance. She is normal weight. She is not ill-appearing, toxic-appearing or diaphoretic.  HENT:     Head: Normocephalic and atraumatic.  Eyes:     Pupils: Pupils are equal, round, and reactive to light.  Cardiovascular:     Rate and Rhythm: Normal rate.     Pulses: Normal pulses.     Heart sounds: Normal heart sounds. No murmur heard.    No friction rub. No gallop.  Pulmonary:     Effort: Pulmonary effort is normal. No respiratory distress.     Breath sounds: Normal breath sounds. No wheezing or rales.  Chest:     Chest wall: No tenderness.  Abdominal:     General: Abdomen is flat. Bowel sounds are normal. There is no distension.     Palpations: Abdomen is soft. There is no mass.     Tenderness: There is no abdominal tenderness. There is no right CVA tenderness, left CVA tenderness, guarding or rebound.     Hernia: No hernia is present.  Musculoskeletal:     Right lower leg: No edema.     Left lower leg: No edema.  Skin:    General: Skin is warm.     Findings: No bruising, erythema or rash.  Neurological:     General: No focal deficit present.     Mental Status: She is alert. Mental status is at baseline.  Psychiatric:        Mood and Affect: Mood normal.      UC Treatments / Results  Labs (all labs ordered are listed, but only abnormal results  are displayed) Labs Reviewed  POCT URINALYSIS DIP (MANUAL ENTRY) - Abnormal; Notable for the following components:      Result Value   Color, UA orange (*)    Glucose, UA =500 (*)    Ketones, POC UA small (15) (*)    Blood, UA moderate (*)    Protein Ur, POC =100 (*)    Nitrite, UA Positive (*)    Leukocytes, UA Small (1+) (*)    All other components within normal limits    EKG   Radiology No results found.  Procedures Procedures (including critical care time)  Medications Ordered in UC Medications - No data to display  Initial Impression / Assessment and Plan / UC Course  I have reviewed the triage vital signs and the nursing notes.  Pertinent labs & imaging results that were available during my care of the patient were reviewed by me and considered in my medical decision making (see chart for details).     Acute cystitis -UA dipstick positive for urinary tract infection.  We will start Macrobid twice daily x5 days, Pyridium as needed.  Increase water intake.  Return to clinic precautions reviewed.   Final Clinical Impressions(s) / UC Diagnoses   Final diagnoses:  Acute cystitis without hematuria     Discharge Instructions      Your symptoms are consistent with a urinary tract infection. Start taking the antibiotic twice daily until completed. You can take Pyridium up to 3 times daily for maximum of 2 days, this is only to help with the frequency and the pain.  Take on an as-needed basis. Monitor for any change in or worsening symptoms; fever, hematuria or flank pain would warrant a recheck  Increase water intake.   ED Prescriptions     Medication Sig Dispense Auth. Provider   nitrofurantoin, macrocrystal-monohydrate, (MACROBID) 100 MG capsule Take 1 capsule (100 mg total) by mouth  2 (two) times daily. 10 capsule Nikole Swartzentruber L, PA   phenazopyridine (PYRIDIUM) 200 MG tablet Take 1 tablet (200 mg total) by mouth 3 (three) times daily. 6 tablet Cassady Turano  L, [place]      PDMP not reviewed this encounter.   Chaney Malling, [place] 06/09/22 1737

## 2022-06-19 ENCOUNTER — Ambulatory Visit: Payer: Self-pay

## 2022-06-19 ENCOUNTER — Ambulatory Visit
Admission: EM | Admit: 2022-06-19 | Discharge: 2022-06-19 | Disposition: A | Discharge status: Home / Self Care | Payer: BLUE CROSS/BLUE SHIELD | Attending: Emergency Medicine | Admitting: Emergency Medicine

## 2022-06-19 DIAGNOSIS — R300 Dysuria: Secondary | ICD-10-CM | POA: Insufficient documentation

## 2022-06-19 DIAGNOSIS — N3000 Acute cystitis without hematuria: Secondary | ICD-10-CM | POA: Insufficient documentation

## 2022-06-19 LAB — POCT URINALYSIS DIP (MANUAL ENTRY)
Bilirubin, UA: NEGATIVE
Glucose, UA: 100 mg/dL — AB
Ketones, POC UA: NEGATIVE mg/dL
Nitrite, UA: POSITIVE — AB
Protein Ur, POC: 100 mg/dL — AB
Spec Grav, UA: 1.025 (ref 1.010–1.025)
Urobilinogen, UA: 1 E.U./dL
pH, UA: 5 (ref 5.0–8.0)

## 2022-06-19 MED ORDER — SULFAMETHOXAZOLE-TRIMETHOPRIM 800-160 MG PO TABS: 1.0000 | ORAL_TABLET | Freq: Two times a day (BID) | ORAL | 0 refills | Status: AC

## 2022-06-19 NOTE — ED Provider Notes (Addendum)
[facility]    CSN: [id number] Arrival date & time: 06/19/22  7530    HISTORY   Chief Complaint  Patient presents with   Urinary Frequency   HPI Danielle Newton is a pleasant, 51 y.o. female who presents to urgent care today. The pt states yesterday she had urinary frequency and discomfort.  States she took Azo last night around 7:30 PM her symptoms.  Per EMR, patient was diagnosed with a urinary tract infection  on September 28, 2023and treated with Macrobid for 5 days.  Patient states all tablets as prescribed.  Patient endorses burning with urination and increased frequency of urination.  Patient denies abnormal odor of urine, increased urge to urinate, sensation of incomplete emptying, suprapubic pain, perineal pain, incontinence of urine, altered mental status, left-sided flank pain, right-sided flank pain, fever, chills, malaise, rigors, significant fatigue, abnormal vaginal discharge, vaginal itching, vaginal irritation, dyspareunia, genital lesion(s), known exposure to STD, and possible exposure to STD.    The history is provided by the patient.  Urinary Frequency This is a recurrent problem. The current episode started yesterday. The problem occurs constantly. The problem has not changed since onset.Pertinent negatives include no headaches. The treatment provided no relief.   Past Medical History:  Diagnosis Date   Obesity, unspecified    Other and unspecified hyperlipidemia    Unspecified essential hypertension    Patient Active Problem List   Diagnosis Date Noted   Pyelonephritis, acute 08/02/2012   Fever and chills 08/02/2012   HYPERTENSION 09/23/2009   HYPERLIPIDEMIA, MILD 04/11/2009   OBESITY 04/11/2009   Past Surgical History:  Procedure Laterality Date   Incision and drainage left breast     VAGINAL DELIVERY  1998, 2000   OB History     Gravida  2   Para  2   Term      Preterm      AB      Living  2      SAB      IAB      Ectopic       Multiple      Live Births             Home Medications    Prior to Admission medications   Medication Sig Start Date End Date Taking? Authorizing Provider  acetaminophen (TYLENOL) 325 MG tablet Take 650 mg by mouth as needed for pain.    [provider]  rosuvastatin (CRESTOR) 5 MG tablet Take 5 mg by mouth daily. 04/19/22   [provider]  TOUJEO SOLOSTAR 300 UNIT/ML Solostar Pen SMARTSIG:12 Unit(s) SUB-Q Daily 05/12/22   [provider]  valsartan-hydrochlorothiazide (DIOVAN-HCT) 80-12.5 MG per tablet TAKE 1 TABLET EVERY DAY  09/26/14   Laurey Morale, MD    Family History Family History  Problem Relation Age of Onset   Hypertension Father    Social History Social History   Tobacco Use   Smoking status: Never   Smokeless tobacco: Never  Substance Use Topics   Alcohol use: No   Drug use: No   Allergies   Penicillins and Benzonatate  Review of Systems Review of Systems  Genitourinary:  Positive for frequency.  Neurological:  Negative for headaches.   Pertinent findings revealed after performing a 14 point review of systems has been noted in the history of present illness.  Physical Exam Triage Vital Signs ED Triage Vitals  Enc Vitals Group     BP 07/09/21 0827 (!) 147/82  Pulse Rate 07/09/21 0827 72     Resp 07/09/21 0827 18     Temp 07/09/21 0827 98.3 F (36.8 C)     Temp Source 07/09/21 0827 Oral     SpO2 07/09/21 0827 98 %     Weight --      Height --      Head Circumference --      Peak Flow --      Pain Score 07/09/21 0826 5     Pain Loc --      Pain Edu? --      Excl. in [place]? --   No data found.  Updated Vital Signs BP (!) 146/85 (BP Location: Left Arm)   Pulse 71   Temp 97.9 F (36.6 C) (Oral)   Resp 16   SpO2 96%   Physical Exam  Visual Acuity Right Eye Distance:   Left Eye Distance:   Bilateral Distance:    Right Eye Near:   Left Eye Near:    Bilateral Near:     UC Couse / Diagnostics /  Procedures:     Radiology No results found.  Procedures Procedures (including critical care time) EKG  Pending results:  Labs Reviewed  POCT URINALYSIS DIP (MANUAL ENTRY) - Abnormal; Notable for the following components:      Result Value   Clarity, UA cloudy (*)    Glucose, UA =100 (*)    Blood, UA large (*)    Protein Ur, POC =100 (*)    Nitrite, UA Positive (*)    Leukocytes, UA Small (1+) (*)    All other components within normal limits  URINE CULTURE    Medications Ordered in UC: Medications - No data to display  UC Diagnoses / Final Clinical Impressions(s)   I have reviewed the triage vital signs and the nursing notes.  Pertinent labs & imaging results that were available during my care of the patient were reviewed by me and considered in my medical decision making (see chart for details).    Final diagnoses:  Dysuria  Acute recurrent cystitis   Patient was advised to begin antibiotics today due to having active symptoms of urinary tract infection.                    Patient was advised to take all doses exactly as prescribed.  Patient also advised of risks of worsening infection with incomplete antibiotic therapy. Patient advised that they will be contacted with results and that adjustments to treatment will be provided as indicated based on the results.   Patient was advised of possibility that urine culture results may be negative if sample provided was obtained late in the day causing urine to be more diluted.  Patient was advised that if antibiotics were effective after the first 24 to 36 hours, despite negative urine culture result, it is recommended that they complete the full course as prescribed.   Return precautions advised.  ED Prescriptions     Medication Sig Dispense Auth. Provider   sulfamethoxazole-trimethoprim (BACTRIM DS) 800-160 MG tablet Take 1 tablet by mouth 2 (two) times daily for 3 days. 6 tablet Lynden Oxford Scales, PA-C      PDMP not  reviewed this encounter.  Disposition Upon Discharge:  Condition: stable for discharge home  Patient presented with concern for an acute illness with associated systemic symptoms and significant discomfort requiring urgent management. In my opinion, this is a condition that a prudent lay person (someone who possesses  an average knowledge of health and medicine) may potentially expect to result in complications if not addressed urgently such as respiratory distress, impairment of bodily function or dysfunction of bodily organs.   As such, the patient has been evaluated and assessed, work-up was performed and treatment was provided in alignment with urgent care protocols and evidence based medicine.  Patient/parent/caregiver has been advised that the patient may require follow up for further testing and/or treatment if the symptoms continue in spite of treatment, as clinically indicated and appropriate.  Routine symptom specific, illness specific and/or disease specific instructions were discussed with the patient and/or caregiver at length.  Prevention strategies for avoiding STD exposure were also discussed.  The patient will follow up with their current PCP if and as advised. If the patient does not currently have a PCP we will assist them in obtaining one.   The patient may need specialty follow up if the symptoms continue, in spite of conservative treatment and management, for further workup, evaluation, consultation and treatment as clinically indicated and appropriate.  Patient/parent/caregiver verbalized understanding and agreement of plan as discussed.  All questions were addressed during visit.  Please see discharge instructions below for further details of plan.  Discharge Instructions:   Discharge Instructions      Common causes of urinary tract infections include but are not limited to holding your urine longer than you should, squatting instead of sitting down when urinating,  sitting around in wet clothing such as a wet swimsuit or gym clothes too long, not emptying your bladder after having sexual intercourse, wiping from back to front instead of front to back after having a bowel movement.  Less common causes of urinary tract infections include but are not limited to anatomical shifts in the location of your bladder or uterus causing obstruction of passage of urine from your bladder to your urethra where your urine comes out or prolapse of your rectum into your vaginal wall.  These less common causes can be evaluated by gynecologist, a urologist or subspecialist called a uro-gynecologist   The urinalysis that we performed in the clinic today was abnormal.  Urine culture will be performed per our protocol.  The result of the urine culture will be available in the next 3 to 5 days and will be posted to your MyChart account.  If there is an abnormal finding, you will be contacted by phone and advised of further treatment recommendations, if any.   You were advised to begin antibiotics today because you are having active symptoms of an acute lower urinary tract infection also known as cystitis.  It is very important that you take all doses exactly as prescribed.  Incomplete antibiotic therapy can cause worsening urinary tract infection that can become aggressive, escape from urinary tract into your bloodstream causing sepsis which will require hospitalization.   Please pick up and begin taking your prescription for Bactrim DS (trimethoprim sulfamethoxazole) as soon as possible.  Please take all doses exactly as prescribed.  You can take this medication with or without food.  This medication is safe to take with your other medications.   If you receive a phone call advising you that your urine culture is negative but you begin to feel better after taking antibiotics for 24 hours, please feel free to complete the full course of antibiotics as they were likely needed and the urine  culture result was false.    If your culture is negative and you do not feel any better,  please return for repeat evaluation of other possible causes of your symptoms.   Please consider abstaining from sexual intercourse while you are being treated for urinary tract infection.   If you have not had complete resolution of your symptoms after completing treatment as prescribed, please return to urgent care for repeat evaluation or follow-up with your primary care provider.   Thank you for visiting urgent care today.  We appreciate the opportunity to participate in your care.       This office note has been dictated using Museum/gallery curator.  Unfortunately, this method of dictation can sometimes lead to typographical or grammatical errors.  I apologize for your inconvenience in advance if this occurs.  Please do not hesitate to reach out to me if clarification is needed.       Lynden Oxford Scales, PA-C 06/19/22 0846    Lynden Oxford Scales, PA-C 06/19/22 [phone]

## 2022-06-19 NOTE — ED Triage Notes (Signed)
The pt states yesterday she had urinary frequency and discomfort.  Home interventions: AZO (last taken last night around 7:30pm)

## 2022-06-19 NOTE — Discharge Instructions (Signed)
Common causes of urinary tract infections include but are not limited to holding your urine longer than you should, squatting instead of sitting down when urinating, sitting around in wet clothing such as a wet swimsuit or gym clothes too long, not emptying your bladder after having sexual intercourse, wiping from back to front instead of front to back after having a bowel movement.  Less common causes of urinary tract infections include but are not limited to anatomical shifts in the location of your bladder or uterus causing obstruction of passage of urine from your bladder to your urethra where your urine comes out or prolapse of your rectum into your vaginal wall.  These less common causes can be evaluated by gynecologist, a urologist or subspecialist called a uro-gynecologist   The urinalysis that we performed in the clinic today was abnormal.  Urine culture will be performed per our protocol.  The result of the urine culture will be available in the next 3 to 5 days and will be posted to your MyChart account.  If there is an abnormal finding, you will be contacted by phone and advised of further treatment recommendations, if any.   You were advised to begin antibiotics today because you are having active symptoms of an acute lower urinary tract infection also known as cystitis.  It is very important that you take all doses exactly as prescribed.  Incomplete antibiotic therapy can cause worsening urinary tract infection that can become aggressive, escape from urinary tract into your bloodstream causing sepsis which will require hospitalization.   Please pick up and begin taking your prescription for Bactrim DS (trimethoprim sulfamethoxazole) as soon as possible.  Please take all doses exactly as prescribed.  You can take this medication with or without food.  This medication is safe to take with your other medications.   If you receive a phone call advising you that your urine culture is negative but  you begin to feel better after taking antibiotics for 24 hours, please feel free to complete the full course of antibiotics as they were likely needed and the urine culture result was false.    If your culture is negative and you do not feel any better, please return for repeat evaluation of other possible causes of your symptoms.   Please consider abstaining from sexual intercourse while you are being treated for urinary tract infection.   If you have not had complete resolution of your symptoms after completing treatment as prescribed, please return to urgent care for repeat evaluation or follow-up with your primary care provider.   Thank you for visiting urgent care today.  We appreciate the opportunity to participate in your care.

## 2022-06-20 LAB — URINE CULTURE

## 2022-06-21 LAB — URINE CULTURE: Culture: 30000 — AB

## 2022-08-17 ENCOUNTER — Other Ambulatory Visit: Payer: Self-pay | Admitting: Family Medicine

## 2022-08-17 DIAGNOSIS — R55 Syncope and collapse: Secondary | ICD-10-CM

## 2022-08-30 ENCOUNTER — Ambulatory Visit
Admission: RE | Admit: 2022-08-30 | Discharge: 2022-08-30 | Disposition: A | Discharge status: Home / Self Care | Payer: BLUE CROSS/BLUE SHIELD | Source: Ambulatory Visit | Attending: Family Medicine | Admitting: Family Medicine

## 2022-08-30 DIAGNOSIS — R55 Syncope and collapse: Secondary | ICD-10-CM

## 2023-10-15 NOTE — Progress Notes (Signed)
 Danielle Newton is a 53 y.o.  with  reports that she has never smoked. She has never used smokeless tobacco. with  Active Ambulatory Problems    Diagnosis Date Noted  . No Active Ambulatory Problems   Resolved Ambulatory Problems    Diagnosis Date Noted  . No Resolved Ambulatory Problems   No Additional Past Medical History   who presents today at Urgent Care for  Chief Complaint  Patient presents with  . Pain     One day last week I had a tender spot on the side of my head behind each ear. I felt achy this morning and chills.       History of Present Illness The patient is a 53 year old female who presents today with tender spots on the side of her head, behind her ears. She has felt achy this morning and has chills.      Patient has no co-morbidities  or medications that might increase the risk for developing a severe infection     reports that she has never smoked. She has never used smokeless tobacco.  Review of Systems  Review of systems is otherwise negative except as noted in the HPI and Assessment/MDM   Physical Exam  BP (!) 160/103 (BP Location: Right arm, Patient Position: Sitting)   Pulse 105   Temp 99.9 F (37.7 C) (Oral)   Resp 16   Ht 1.549 m (5' 1)   Wt 77.1 kg (170 lb)   LMP  (LMP Unknown)   SpO2 99%   BMI 32.12 kg/m   Constitutional:      General: Patient is not in acute distress.    Appearance: Normal appearance.  Neurological:     General: No focal deficit present.     Mental Status: alert and oriented to person, place, and time.  HENT:     Eyes:     Conjunctiva/sclera: Conjunctivae normal. Pharynx normal    There is no associated anterior cervical lymphadenopathy    Pupils: Pupils are equal, round, and reactive to light.     TM's normal with no visible effusion  Cardiovascular:     Heart sounds: Normal heart sounds. No murmur heard.    No Lower extremity edema noted Pulmonary:     Effort: Pulmonary effort is normal. No  respiratory distress.     Breath sounds: No wheezing, rhonchi or rales.  Abdominal:     General: Bowel sounds are normal. There is no distension.     Tenderness: There is no abdominal tenderness.  Musculoskeletal:        General: Normal range of motion.     Neck:  No rigidity.  Skin:    General: Skin is warm and dry.  Psychiatric:        Mood and Affect: Mood normal.   No orders to display     DIAGNOSIS/PLAN     1. Other non-recurrent acute nonsuppurative otitis media of both ears  amoxicillin (AMOXIL) 875 mg tablet    2. Fever, unspecified fever cause  POC SARS-COV-2 SYMPTOMATIC (IDNOW)   POC Influenza A&B NAT (IDNOW)    3. Body aches  POC SARS-COV-2 SYMPTOMATIC (IDNOW)   POC Influenza A&B NAT (IDNOW)      Assessment & Plan 1. Otitis media. She reports tender spots on the side of her head and behind her ears, along with feeling achy and experiencing chills. Her influenza and COVID-19 tests were negative. A therapeutic regimen of amoxicillin 500 mg, to be taken three  times daily for 10 days, has been prescribed and sent to her pharmacy. She declined a steroid today. If symptoms worsen, she is advised to follow up.    We discussed risks and side effects of medications, and also discussed red flags which would warrant immediate follow-up.   Urgent Care Disposition:  Home Care

## 2023-11-29 NOTE — Progress Notes (Signed)
 Danielle Newton is a 53 y.o.  with  reports that she has never smoked. She has never used smokeless tobacco. with  Active Ambulatory Problems    Diagnosis Date Noted  . No Active Ambulatory Problems   Resolved Ambulatory Problems    Diagnosis Date Noted  . No Resolved Ambulatory Problems   Past Medical History:  Diagnosis Date  . Diabetes mellitus (CMD)   . Hypertension    who presents today at Urgent Care for  Chief Complaint  Patient presents with  . Cough  . Fever  . Nasal Congestion    Cough started yesterday with fever and congestion this morning. Has taken tylenol .        History of Present Illness The patient is a 53 year old female who presents with a cough that started yesterday. She has been having some fever and congestion this morning. She has been taking Tylenol  for the fever.  She reports a history of COVID-19 infection but has not received any vaccinations against it. 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 She has been taking Tylenol  for the fever.  MEDICATIONS Current: Tylenol       Patient has no co-morbidities  or medications that might increase the risk for developing a severe infection     reports that she has never smoked. She has never used smokeless tobacco.  Review of Systems  Review of systems is otherwise negative except as noted in the HPI and Assessment/MDM   Physical Exam  BP (!) 154/96   Pulse 92   Temp 97.9 F (36.6 C) (Tympanic)   Resp 16   Wt 77.1 kg (170 lb)   LMP  (LMP Unknown)   SpO2 100%   BMI 32.12 kg/m   Constitutional:      General: Patient is not in acute distress.    Appearance: Normal appearance.  Neurological:     General: No focal deficit present.     Mental Status: alert and oriented to person, place, and time.  HENT:     Eyes:     Conjunctiva/sclera: Conjunctivae normal. Pharynx normal    There is no  associated anterior cervical lymphadenopathy    Pupils: Pupils are equal, round, and reactive to light.     TM's normal with no visible effusion  Cardiovascular:     Heart sounds: Normal heart sounds. No murmur heard.    No Lower extremity edema noted Pulmonary:     Effort: Pulmonary effort is normal. No respiratory distress.     Breath sounds: No wheezing, rhonchi or rales.  Abdominal:     General: Bowel sounds are normal. There is no distension.     Tenderness: There is no abdominal tenderness.  Musculoskeletal:        General: Normal range of motion.     Neck:  No rigidity.  Skin:    General: Skin is warm and dry.  Psychiatric:        Mood and Affect: Mood normal.   No orders to display            DIAGNOSIS/PLAN     1. COVID-19  nirmatrelvir-ritonavir (PAXLOVID) 300 mg (150 mg x 2)-100 mg dose pack    2. Fever, unspecified fever cause  POC SARS-COV-2 SYMPTOMATIC (IDNOW)   POC Influenza A&B NAT (IDNOW)    3. Acute cough  POC SARS-COV-2 SYMPTOMATIC (IDNOW)   POC Influenza A&B NAT (IDNOW)  Assessment & Plan 1. COVID-19. She tested positive for COVID-19. She has been experiencing a cough since yesterday, along with fever and congestion starting this morning. She has been taking Tylenol  for fever management. A prescription for Paxlovid will be sent to [facility] on [address]. She is advised to continue using Tylenol  or ibuprofen for fever and body aches. A 5-day quarantine period is recommended, followed by mask usage if symptoms persist. She should limit contact with Danielle Newton, who has high blood pressure and Parkinson's, and practice good hand hygiene to prevent transmission.    We discussed risks and side effects of medications, and also discussed red flags which would warrant immediate follow-up.   Urgent Care Disposition:  Home Care

## 2024-03-16 ENCOUNTER — Emergency Department (HOSPITAL_COMMUNITY): Admitting: Anesthesiology

## 2024-03-16 ENCOUNTER — Emergency Department (HOSPITAL_COMMUNITY)

## 2024-03-16 ENCOUNTER — Other Ambulatory Visit: Payer: Self-pay

## 2024-03-16 ENCOUNTER — Ambulatory Visit
Admission: EM | Admit: 2024-03-16 | Discharge: 2024-03-16 | Disposition: A | Discharge status: Other Acute Inpatient Hospital | Attending: Family Medicine | Admitting: Family Medicine

## 2024-03-16 ENCOUNTER — Ambulatory Visit (HOSPITAL_COMMUNITY)
Admission: EM | Admit: 2024-03-16 | Discharge: 2024-03-16 | Disposition: A | Discharge status: Home / Self Care | Attending: Emergency Medicine | Admitting: Emergency Medicine

## 2024-03-16 ENCOUNTER — Encounter (HOSPITAL_COMMUNITY)
Admission: EM | Disposition: A | Discharge status: Home / Self Care | Payer: Self-pay | Source: Home / Self Care | Attending: Emergency Medicine

## 2024-03-16 ENCOUNTER — Encounter (HOSPITAL_COMMUNITY): Payer: Self-pay

## 2024-03-16 DIAGNOSIS — Z23 Encounter for immunization: Secondary | ICD-10-CM | POA: Insufficient documentation

## 2024-03-16 DIAGNOSIS — E119 Type 2 diabetes mellitus without complications: Secondary | ICD-10-CM | POA: Diagnosis not present

## 2024-03-16 DIAGNOSIS — S68613A Complete traumatic transphalangeal amputation of left middle finger, initial encounter: Secondary | ICD-10-CM | POA: Diagnosis present

## 2024-03-16 DIAGNOSIS — S62633B Displaced fracture of distal phalanx of left middle finger, initial encounter for open fracture: Secondary | ICD-10-CM | POA: Diagnosis present

## 2024-03-16 DIAGNOSIS — W271XXA Contact with garden tool, initial encounter: Secondary | ICD-10-CM | POA: Insufficient documentation

## 2024-03-16 DIAGNOSIS — I10 Essential (primary) hypertension: Secondary | ICD-10-CM | POA: Diagnosis not present

## 2024-03-16 DIAGNOSIS — S61213A Laceration without foreign body of left middle finger without damage to nail, initial encounter: Secondary | ICD-10-CM

## 2024-03-16 DIAGNOSIS — Z794 Long term (current) use of insulin: Secondary | ICD-10-CM | POA: Diagnosis not present

## 2024-03-16 HISTORY — PX: INCISION AND DRAINAGE OF WOUND: SHX1803

## 2024-03-16 LAB — BASIC METABOLIC PANEL WITH GFR
Anion gap: 11 (ref 5–15)
BUN: 23 mg/dL — ABNORMAL HIGH (ref 6–20)
CO2: 25 mmol/L (ref 22–32)
Calcium: 9.7 mg/dL (ref 8.9–10.3)
Chloride: 101 mmol/L (ref 98–111)
Creatinine, Ser: 0.68 mg/dL (ref 0.44–1.00)
GFR, Estimated: >60 mL/min (ref >60)
Glucose, Bld: 237 mg/dL — ABNORMAL HIGH (ref 70–99)
Potassium: 3.8 mmol/L (ref 3.5–5.1)
Sodium: 137 mmol/L (ref 135–145)

## 2024-03-16 LAB — CBC WITH DIFFERENTIAL/PLATELET
Abs Immature Granulocytes: 0.04 K/uL (ref 0.00–0.07)
Basophils Absolute: 0.1 K/uL (ref 0.0–0.1)
Basophils Relative: 1 %
Eosinophils Absolute: 0.4 K/uL (ref 0.0–0.5)
Eosinophils Relative: 4 %
HCT: 43.9 % (ref 36.0–46.0)
Hemoglobin: 14.6 g/dL (ref 12.0–15.0)
Immature Granulocytes: 0 %
Lymphocytes Relative: 13 %
Lymphs Abs: 1.3 K/uL (ref 0.7–4.0)
MCH: 28 pg (ref 26.0–34.0)
MCHC: 33.3 g/dL (ref 30.0–36.0)
MCV: 84.3 fL (ref 80.0–100.0)
Monocytes Absolute: 0.4 K/uL (ref 0.1–1.0)
Monocytes Relative: 4 %
Neutro Abs: 7.3 K/uL (ref 1.7–7.7)
Neutrophils Relative %: 78 %
Platelets: 245 K/uL (ref 150–400)
RBC: 5.21 MIL/uL — ABNORMAL HIGH (ref 3.87–5.11)
RDW: 13 % (ref 11.5–15.5)
WBC: 9.4 K/uL (ref 4.0–10.5)
nRBC: 0 % (ref 0.0–0.2)

## 2024-03-16 LAB — GLUCOSE, CAPILLARY: Glucose-Capillary: 227 mg/dL — ABNORMAL HIGH (ref 70–99)

## 2024-03-16 SURGERY — IRRIGATION AND DEBRIDEMENT WOUND
Anesthesia: Monitor Anesthesia Care | Laterality: Left

## 2024-03-16 MED ORDER — MIDAZOLAM HCL 2 MG/2ML IJ SOLN
INTRAMUSCULAR | Status: DC | PRN
  Administered 2024-03-16 (×2): 1 mg via INTRAVENOUS

## 2024-03-16 MED ORDER — CEFAZOLIN SODIUM-DEXTROSE 2-4 GM/100ML-% IV SOLN
2.0000 g | Freq: Once | INTRAVENOUS | Status: AC
  Administered 2024-03-16: 2 g via INTRAVENOUS
  Filled 2024-03-16: qty 100

## 2024-03-16 MED ORDER — CHLORHEXIDINE GLUCONATE 0.12 % MT SOLN
15.0000 mL | Freq: Once | OROMUCOSAL | Status: AC
  Administered 2024-03-16: 15 mL via OROMUCOSAL

## 2024-03-16 MED ORDER — BUPIVACAINE HCL (PF) 0.25 % IJ SOLN
INTRAMUSCULAR | Status: AC
  Filled 2024-03-16: qty 10

## 2024-03-16 MED ORDER — FENTANYL CITRATE (PF) 250 MCG/5ML IJ SOLN
INTRAMUSCULAR | Status: DC | PRN
  Administered 2024-03-16 (×3): 50 ug via INTRAVENOUS

## 2024-03-16 MED ORDER — LIDOCAINE HCL (PF) 1 % IJ SOLN
INTRAMUSCULAR | Status: DC | PRN
  Administered 2024-03-16: 5 mL

## 2024-03-16 MED ORDER — OXYCODONE HCL 5 MG PO TABS: 5.0000 mg | ORAL_TABLET | Freq: Once | ORAL | Status: DC | PRN

## 2024-03-16 MED ORDER — ORAL CARE MOUTH RINSE
15.0000 mL | Freq: Once | OROMUCOSAL | Status: AC
Start: 2024-03-16 — End: 2024-03-16

## 2024-03-16 MED ORDER — CHLORHEXIDINE GLUCONATE 0.12 % MT SOLN
OROMUCOSAL | Status: AC
  Filled 2024-03-16: qty 15

## 2024-03-16 MED ORDER — LIDOCAINE HCL (PF) 1 % IJ SOLN
5.0000 mL | Freq: Once | INTRAMUSCULAR | Status: AC
  Administered 2024-03-16: 5 mL
  Filled 2024-03-16: qty 5

## 2024-03-16 MED ORDER — AMISULPRIDE (ANTIEMETIC) 5 MG/2ML IV SOLN: 10.0000 mg | Freq: Once | INTRAVENOUS | Status: DC | PRN

## 2024-03-16 MED ORDER — LIDOCAINE HCL (PF) 1 % IJ SOLN
INTRAMUSCULAR | Status: AC
  Filled 2024-03-16: qty 5

## 2024-03-16 MED ORDER — TRAMADOL HCL 50 MG PO TABS: 50.0000 mg | ORAL_TABLET | Freq: Four times a day (QID) | ORAL | 0 refills | Status: AC | PRN

## 2024-03-16 MED ORDER — MIDAZOLAM HCL 2 MG/2ML IJ SOLN
INTRAMUSCULAR | Status: AC
  Filled 2024-03-16: qty 2

## 2024-03-16 MED ORDER — PHENYLEPHRINE 80 MCG/ML (10ML) SYRINGE FOR IV PUSH (FOR BLOOD PRESSURE SUPPORT)
PREFILLED_SYRINGE | INTRAVENOUS | Status: AC
  Filled 2024-03-16: qty 10

## 2024-03-16 MED ORDER — FENTANYL CITRATE PF 50 MCG/ML IJ SOSY: 50.0000 ug | PREFILLED_SYRINGE | Freq: Once | INTRAMUSCULAR | Status: DC

## 2024-03-16 MED ORDER — INSULIN ASPART 100 UNIT/ML IJ SOLN
0.0000 [IU] | INTRAMUSCULAR | Status: DC | PRN
  Administered 2024-03-16: 6 [IU] via SUBCUTANEOUS

## 2024-03-16 MED ORDER — ACETAMINOPHEN 10 MG/ML IV SOLN: 1000.0000 mg | Freq: Once | INTRAVENOUS | Status: DC | PRN

## 2024-03-16 MED ORDER — FENTANYL CITRATE (PF) 250 MCG/5ML IJ SOLN
INTRAMUSCULAR | Status: AC
  Filled 2024-03-16: qty 5

## 2024-03-16 MED ORDER — FENTANYL CITRATE (PF) 100 MCG/2ML IJ SOLN: 25.0000 ug | INTRAMUSCULAR | Status: DC | PRN

## 2024-03-16 MED ORDER — DIPHENHYDRAMINE HCL 50 MG/ML IJ SOLN
25.0000 mg | Freq: Once | INTRAMUSCULAR | Status: AC
  Administered 2024-03-16: 25 mg via INTRAVENOUS
  Filled 2024-03-16: qty 1

## 2024-03-16 MED ORDER — PROPOFOL 10 MG/ML IV BOLUS
INTRAVENOUS | Status: AC
  Filled 2024-03-16: qty 20

## 2024-03-16 MED ORDER — BUPIVACAINE HCL 0.25 % IJ SOLN
INTRAMUSCULAR | Status: DC | PRN
  Administered 2024-03-16: 5 mL

## 2024-03-16 MED ORDER — LACTATED RINGERS IV SOLN: INTRAVENOUS | Status: DC

## 2024-03-16 MED ORDER — 0.9 % SODIUM CHLORIDE (POUR BTL) OPTIME
TOPICAL | Status: DC | PRN
  Administered 2024-03-16: 1000 mL

## 2024-03-16 MED ORDER — CEPHALEXIN 500 MG PO CAPS: 500.0000 mg | ORAL_CAPSULE | Freq: Four times a day (QID) | ORAL | 0 refills | Status: AC

## 2024-03-16 MED ORDER — TETANUS-DIPHTH-ACELL PERTUSSIS 5-2.5-18.5 LF-MCG/0.5 IM SUSY
0.5000 mL | PREFILLED_SYRINGE | Freq: Once | INTRAMUSCULAR | Status: AC
  Administered 2024-03-16: 0.5 mL via INTRAMUSCULAR
  Filled 2024-03-16: qty 0.5

## 2024-03-16 MED ORDER — PHENYLEPHRINE 80 MCG/ML (10ML) SYRINGE FOR IV PUSH (FOR BLOOD PRESSURE SUPPORT)
PREFILLED_SYRINGE | INTRAVENOUS | Status: DC | PRN
  Administered 2024-03-16: 160 ug via INTRAVENOUS

## 2024-03-16 MED ORDER — OXYCODONE HCL 5 MG/5ML PO SOLN: 5.0000 mg | Freq: Once | ORAL | Status: DC | PRN

## 2024-03-16 MED ORDER — PROPOFOL 10 MG/ML IV BOLUS
INTRAVENOUS | Status: DC | PRN
  Administered 2024-03-16: 30 mg via INTRAVENOUS
  Administered 2024-03-16 (×3): 20 mg via INTRAVENOUS

## 2024-03-16 MED ORDER — CEFAZOLIN SODIUM-DEXTROSE 2-4 GM/100ML-% IV SOLN
INTRAVENOUS | Status: AC
  Filled 2024-03-16: qty 100

## 2024-03-16 MED ORDER — LACTATED RINGERS IV SOLN: INTRAVENOUS | Status: DC | PRN

## 2024-03-16 SURGICAL SUPPLY — 30 items
BAG COUNTER SPONGE SURGICOUNT (BAG) ×1 IMPLANT
BNDG COHESIVE 1X5 TAN STRL LF (GAUZE/BANDAGES/DRESSINGS) IMPLANT
BNDG COMPR ESMARK 4X3 LF (GAUZE/BANDAGES/DRESSINGS) ×1 IMPLANT
BNDG ELASTIC 4X5.8 VLCR STR LF (GAUZE/BANDAGES/DRESSINGS) ×1 IMPLANT
BNDG GAUZE DERMACEA FLUFF 4 (GAUZE/BANDAGES/DRESSINGS) ×1 IMPLANT
BNDG STRETCH GAUZE 3IN X12FT (GAUZE/BANDAGES/DRESSINGS) IMPLANT
CHLORAPREP W/TINT 26 (MISCELLANEOUS) ×1 IMPLANT
CORD BIPOLAR FORCEPS 12FT (ELECTRODE) ×1 IMPLANT
DRSG ADAPTIC 3X8 NADH LF (GAUZE/BANDAGES/DRESSINGS) ×1 IMPLANT
DRSG XEROFORM 1X8 (GAUZE/BANDAGES/DRESSINGS) IMPLANT
ELECTRODE REM PT RTRN 9FT ADLT (ELECTROSURGICAL) IMPLANT
GAUZE SPONGE 4X4 12PLY STRL (GAUZE/BANDAGES/DRESSINGS) ×1 IMPLANT
GAUZE XEROFORM 1X8 LF (GAUZE/BANDAGES/DRESSINGS) ×1 IMPLANT
GLOVE BIOGEL M 8.0 STRL (GLOVE) ×1 IMPLANT
GOWN STRL REUS W/ TWL LRG LVL3 (GOWN DISPOSABLE) ×1 IMPLANT
GOWN STRL REUS W/ TWL XL LVL3 (GOWN DISPOSABLE) ×1 IMPLANT
KIT BASIN OR (CUSTOM PROCEDURE TRAY) ×1 IMPLANT
KIT TURNOVER KIT B (KITS) ×1 IMPLANT
NS IRRIG 1000ML POUR BTL (IV SOLUTION) ×1 IMPLANT
PACK ORTHO EXTREMITY (CUSTOM PROCEDURE TRAY) ×1 IMPLANT
PAD ARMBOARD POSITIONER FOAM (MISCELLANEOUS) ×2 IMPLANT
PAD CAST 4YDX4 CTTN HI CHSV (CAST SUPPLIES) ×1 IMPLANT
SOAP 2 % CHG 4 OZ (WOUND CARE) ×1 IMPLANT
SPONGE T-LAP 18X18 ~~LOC~~+RFID (SPONGE) ×1 IMPLANT
SPONGE T-LAP 4X18 ~~LOC~~+RFID (SPONGE) ×1 IMPLANT
TOWEL GREEN STERILE (TOWEL DISPOSABLE) ×1 IMPLANT
TOWEL GREEN STERILE FF (TOWEL DISPOSABLE) ×1 IMPLANT
TUBE CONNECTING 12X1/4 (SUCTIONS) IMPLANT
WATER STERILE IRR 1000ML POUR (IV SOLUTION) ×1 IMPLANT
YANKAUER SUCT BULB TIP NO VENT (SUCTIONS) IMPLANT

## 2024-03-16 NOTE — Discharge Instructions (Signed)
 The patient will go to the emergency room for further evaluation and treatment

## 2024-03-16 NOTE — Anesthesia Postprocedure Evaluation (Signed)
 Anesthesia Post Note  Patient: Danielle Newton  Procedure(s) Performed: IRRIGATION AND DEBRIDEMENT WOUND (Left)     Patient location during evaluation: PACU Anesthesia Type: MAC Level of consciousness: awake Pain management: pain level controlled Vital Signs Assessment: post-procedure vital signs reviewed and stable Respiratory status: spontaneous breathing, nonlabored ventilation and respiratory function stable Cardiovascular status: blood pressure returned to baseline and stable Postop Assessment: no apparent nausea or vomiting Anesthetic complications: no   There were no known notable events for this encounter.  Last Vitals:  Vitals:   03/16/24 1515 03/16/24 1520  BP: 120/84 (!) 125/91  Pulse: 83 86  Resp: 19 15  Temp:  36.4 C  SpO2: 94% 94%    Last Pain:  Vitals:   03/16/24 1520  TempSrc:   PainSc: 0-No pain                 Rickell Wiehe P Rashaud Ybarbo

## 2024-03-16 NOTE — ED Notes (Signed)
 Finger tourniquet applied at 1150

## 2024-03-16 NOTE — ED Triage Notes (Signed)
 Pt was pruning a rose brush and lacerated her left middle finger this morning. Pt sent here from [place]  for further eval. Pt currently has gardening glove still on and coban wrapping the finger. Pt takes a daily ASA

## 2024-03-16 NOTE — Discharge Instructions (Signed)
 Discharge Instructions:  Keep your dressing clean, dry and in place until instructed to remove by Dr. Izora Ribas.  If the dressing becomes dirty or wet call the office for instructions during business hours. Elevate the extremity to help with swelling, this will also help with any discomfort. Take your medication as prescribed. No lifting with the injured  extremity. If you feel that the dressing is too tight, you may loosen it, but keep it on; finger tips should be pink; if there is a concern, call the office. [phone] Ice may be used if the injury is a fracture, do not apply ice directly to the skin. Please call the office on the next business day after discharge to arrange a follow up appointment.  Call [phone] between the hours of 9am - 5pm M-Th or 9am - 1pm on Fri. For most hand injuries and/or conditions, you may return to work using the uninjured hand (one handed duty) within 24-72 hours.  A detailed note will be provided to you at your follow up appointment or may contact the office prior to your follow up.

## 2024-03-16 NOTE — ED Provider Notes (Signed)
  EMERGENCY DEPARTMENT AT [facility] Provider Note   CSN: [id number] Arrival date & time: 03/16/24  1121     Patient presents with: Extremity Laceration  Danielle Newton is a 53 y.o. female who presents to the emergency department after urgent care evaluation for left middle finger injury.  Patient states that she was trimming bushes with a hedge tremor at her home earlier today whenever she cut her left middle finger.  At presentation to the urgent care patient still had on gardening glove and was applying pressure to the area.  Patient was then instructed to come to the emergency department, tourniquet to middle finger applied in triage.  Patient has past medical history significant for hyperlipidemia, hypertension, takes an aspirin daily but denies other blood thinners.  Denies any other injury other than to left middle finger.   HPI     Prior to Admission medications   Medication Sig Start Date End Date Taking? Authorizing Provider  cephALEXin  (KEFLEX ) 500 MG capsule Take 1 capsule (500 mg total) by mouth 4 (four) times daily. 03/16/24  Yes Lorretta Dess, MD  traMADol  (ULTRAM ) 50 MG tablet Take 1 tablet (50 mg total) by mouth every 6 (six) hours as needed. 03/16/24 03/16/25 Yes Lorretta Dess, MD  acetaminophen  (TYLENOL ) 325 MG tablet Take 650 mg by mouth as needed for pain.    [provider]  albuterol (VENTOLIN HFA) 108 (90 Base) MCG/ACT inhaler Inhale 2 puffs into the lungs every 4 (four) hours as needed. 04/28/21   [provider]  aspirin EC 81 MG tablet Take 81 mg by mouth daily. Swallow whole.    [provider]  cloNIDine (CATAPRES) 0.2 MG tablet Take 0.2 mg by mouth daily. 01/01/17   [provider]  rosuvastatin (CRESTOR) 5 MG tablet Take 5 mg by mouth daily. 04/19/22   [provider]  TOUJEO SOLOSTAR 300 UNIT/ML Solostar Pen SMARTSIG:12 Unit(s) SUB-Q Daily 05/12/22   [provider]   valsartan -hydrochlorothiazide  (DIOVAN -HCT) 80-12.5 MG per tablet TAKE 1 TABLET EVERY DAY **NEED APPT FOR MORE REFILLS 09/26/14   Rolan Ezra RAMAN, MD   Allergies: Penicillins and Benzonatate    Review of Systems  Skin:  Positive for wound.   Updated Vital Signs BP (!) 125/91   Pulse 86   Temp 97.6 F (36.4 C)   Resp 15   Ht 5' 1 (1.549 m)   Wt 94.3 kg   SpO2 94%   BMI 39.28 kg/m   Physical Exam Vitals and nursing note reviewed.  Constitutional:      General: She is awake. She is not in acute distress.    Appearance: Normal appearance. She is obese. She is not ill-appearing, toxic-appearing or diaphoretic.  HENT:     Head: Normocephalic and atraumatic.  Pulmonary:     Effort: Pulmonary effort is normal. No respiratory distress.  Musculoskeletal:     Comments: At time of my physical exam tourniquet and tip of left gardening glove present on left middle finger.  Tip of gardening glove removed from left middle finger, at this time distal left middle finger detached with clean cut, bleeding controlled.  Patient able to supinate and pronate left upper extremity.  Radial pulse of left upper extremity 2+.  Skin:    General: Skin is warm and dry.     Capillary Refill: Capillary refill takes less than 2 seconds.  Neurological:     General: No focal deficit present.     Mental Status: She  is alert and oriented to person, place, and time.     Coordination: Coordination normal.     Gait: Gait normal.  Psychiatric:        Mood and Affect: Mood normal.        Behavior: Behavior normal. Behavior is cooperative.     (all labs ordered are listed, but only abnormal results are displayed) Labs Reviewed  CBC WITH DIFFERENTIAL/PLATELET - Abnormal; Notable for the following components:      Result Value   RBC 5.21 (*)    All other components within normal limits  BASIC METABOLIC PANEL WITH GFR - Abnormal; Notable for the following components:   Glucose, Bld 237 (*)    BUN 23 (*)     All other components within normal limits  GLUCOSE, CAPILLARY - Abnormal; Notable for the following components:   Glucose-Capillary 227 (*)    All other components within normal limits   EKG: None  Radiology: DG Finger Middle Left Result Date: 03/16/2024 CLINICAL DATA:  [id number] Laceration of finger [id number] EXAM: LEFT MIDDLE FINGER 2+V COMPARISON:  None Available. FINDINGS: Osteotomy like amputation of the distal phalanx third digit through proximal metaphysis. Adjacent amputated tip of the finger noted. No foreign body. IMPRESSION: Amputation of the distal phalanx third digit. Electronically Signed   By: Jackquline Boxer M.D.   On: 03/16/2024 12:53     Procedures   Medications Ordered in the ED  lidocaine  (PF) (XYLOCAINE ) 1 % injection 5 mL (5 mLs Infiltration Given 03/16/24 1314)  Tdap (BOOSTRIX ) injection 0.5 mL (0.5 mLs Intramuscular Given 03/16/24 1325)  ceFAZolin  (ANCEF ) IVPB 2g/100 mL premix (2 g Intravenous New Bag/Given 03/16/24 1320)  diphenhydrAMINE  (BENADRYL ) injection 25 mg (25 mg Intravenous Given 03/16/24 1318)  chlorhexidine  (PERIDEX ) 0.12 % solution 15 mL (15 mLs Mouth/Throat Given 03/16/24 1356)    Or  Oral care mouth rinse ( Mouth Rinse See Alternative 03/16/24 1356)                                   Medical Decision Making Amount and/or Complexity of Data Reviewed Labs: ordered. Radiology: ordered.  Risk Prescription drug management.   Patient presents to the ED for concern of left middle finger laceration, this involves an extensive number of treatment options, and is a complaint that carries with it a high risk of complications and morbidity.  The differential diagnosis includes laceration, open fracture, open dislocation, soft tissue injury, neurovascular injury, etc.   Co morbidities that complicate the patient evaluation  Hypertension, hyperlipidemia, diabetes   Lab Tests:  I Ordered, and personally interpreted labs.  The pertinent results include: CBC  unremarkable, BMP shows slightly elevated glucose of 237, patient has a prior medical history of diabetes   Imaging Studies ordered:  I ordered imaging studies including Xray left hand I independently visualized and interpreted imaging which showed amputation of distal phalanx of third digit of left hand I agree with the radiologist interpretation   Cardiac Monitoring: EKG ordered which showed sinus tachycardia, no obvious ST segment elevation by my interpretation    Medicines ordered and prescription drug management:  I ordered medication including lidocaine  for digital block, fentanyl  for pain, Benadryl  and Ancef  for open fracture   Reevaluation of the patient after these medicines showed that the patient improved I have reviewed the patients home medicines and have made adjustments as needed   Test Considered:  none   Critical Interventions:  none   Consultations Obtained:  I requested consultation with the hand surgery team, Dr. Lorretta,  and discussed lab and imaging findings as well as pertinent plan - they recommend: Surgical intervention this evening, instructed to place patient n.p.o., get basic labs   Problem List / ED Course:  53 year old female, left middle finger extremity laceration with hedge tremor at home, patient seen in urgent care, bleeding controlled at this time, tourniquet placed in triage When tip of guarding glove removed from left middle finger, clean laceration distal to DIP of left middle finger, open fracture, confirmed by x-ray Spoke with hand team Dr. Lorretta who recommends surgical intervention this afternoon, patient placed n.p.o., basic labs and EKG obtained, left middle finger digital block completed, fentanyl , Ancef , Benadryl  given Plan for shaving down bone in operating room, not reattachment per Dr. Lorretta Tdap updated Distal tip of left middle finger placed on ice in room after removal of glove, tourniquet removed from left middle finger,  bleeding controlled with minimal to no pressure.  Patient off the floor and sent to pre-op for operative management and care per hand team.  Patient vital signs remained stable throughout this encounter. Patient taken to preop for surgical intervention by hand team per Dr. Lorretta who states they will plan for discharge following surgery. Patient moved off the floor to preop  Reevaluation:  After the interventions noted above, I reevaluated the patient and found that they have :improved  Social Determinants of Health:  none   Dispostion:  After consideration of the diagnostic results and the patients response to treatment, I feel that the patent would benefit from surgical treatment this afternoon by hand team per Dr. Lorretta.      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

## 2024-03-16 NOTE — H&P (Signed)
 Reason for Consult:Finger Injury  Referring Physician: Lorretta  CC:I cut my finger  HPI:  Danielle Newton is an 53 y.o. right handed female who presents with amputation of LLF tip.  Pt was using a hedge trimmer, was wearing gloves and cut her finger, presented to urgent care and was referred to [facility] ER.  No current bleeding.        Pain is rated at   6 /10 and is described as sharp.  Pain is constant.  Pain is made better by rest/immobilization, worse with motion.   Associated signs/symptoms:none  Previous treatment:  non contributory  Past Medical History:  Diagnosis Date   Obesity, unspecified    Other and unspecified hyperlipidemia    Unspecified essential hypertension     Past Surgical History:  Procedure Laterality Date   Incision and drainage left breast     VAGINAL DELIVERY  1998, 2000    Family History  Problem Relation Age of Onset   Hypertension Father     Social History:  reports that she has never smoked. She has never used smokeless tobacco. She reports that she does not drink alcohol and does not use drugs.  Allergies:  Allergies  Allergen Reactions   Penicillins Hives   Benzonatate Rash    Medications: I have reviewed the patient's current medications.  Results for orders placed or performed during the hospital encounter of 03/16/24 (from the past 48 hours)  CBC with Differential     Status: Abnormal   Collection Time: 03/16/24 12:10 PM  Result Value Ref Range   WBC 9.4 4.0 - 10.5 K/uL   RBC 5.21 (H) 3.87 - 5.11 MIL/uL   Hemoglobin 14.6 12.0 - 15.0 g/dL   HCT 56.0 63.9 - 53.9 %   MCV 84.3 80.0 - 100.0 fL   MCH 28.0 26.0 - 34.0 pg   MCHC 33.3 30.0 - 36.0 g/dL   RDW 86.9 88.4 - 84.4 %   Platelets 245 150 - 400 K/uL   nRBC 0.0 0.0 - 0.2 %   Neutrophils Relative % 78 %   Neutro Abs 7.3 1.7 - 7.7 K/uL   Lymphocytes Relative 13 %   Lymphs Abs 1.3 0.7 - 4.0 K/uL   Monocytes Relative 4 %   Monocytes Absolute 0.4 0.1 - 1.0 K/uL   Eosinophils Relative 4 %    Eosinophils Absolute 0.4 0.0 - 0.5 K/uL   Basophils Relative 1 %   Basophils Absolute 0.1 0.0 - 0.1 K/uL   Immature Granulocytes 0 %   Abs Immature Granulocytes 0.04 0.00 - 0.07 K/uL    Comment: Performed at [facility], [address] [place], [place] [address]  Basic metabolic panel     Status: Abnormal   Collection Time: 03/16/24 12:10 PM  Result Value Ref Range   Sodium 137 135 - 145 mmol/L   Potassium 3.8 3.5 - 5.1 mmol/L   Chloride 101 98 - 111 mmol/L   CO2 25 22 - 32 mmol/L   Glucose, Bld 237 (H) 70 - 99 mg/dL    Comment: Glucose reference range applies only to samples taken after fasting for at least 8 hours.   BUN 23 (H) 6 - 20 mg/dL   Creatinine, Ser 9.31 0.44 - 1.00 mg/dL   Calcium 9.7 8.9 - 89.6 mg/dL   GFR, Estimated >39 >39 mL/min    Comment: (NOTE) Calculated using the CKD-EPI Creatinine Equation (2021)    Anion gap 11 5 - 15  Comment: Performed at [facility], [address], [place], [place] [address]  Glucose, capillary     Status: Abnormal   Collection Time: 03/16/24  1:48 PM  Result Value Ref Range   Glucose-Capillary 227 (H) 70 - 99 mg/dL    Comment: Glucose reference range applies only to samples taken after fasting for at least 8 hours.    DG Finger Middle Left Result Date: 03/16/2024 CLINICAL DATA:  [id number] Laceration of finger [id number] EXAM: LEFT MIDDLE FINGER 2+V COMPARISON:  None Available. FINDINGS: Osteotomy like amputation of the distal phalanx third digit through proximal metaphysis. Adjacent amputated tip of the finger noted. No foreign body. IMPRESSION: Amputation of the distal phalanx third digit. Electronically Signed   By: Jackquline Boxer M.D.   On: 03/16/2024 12:53    Pertinent items are noted in HPI. Temp:  [98.2 F (36.8 C)-98.5 F (36.9 C)] 98.5 F (36.9 C) (07/05 1128) Pulse Rate:  [101-102] 102 (07/05 1128) Resp:  [20-24] 24 (07/05 1128) BP: (159-165)/(93-98) 165/93 (07/05 1128) SpO2:  [95 %-96 %] 96 % (07/05  1128) Weight:  [94.3 kg] 94.3 kg (07/05 1339) General appearance: alert and cooperative Resp: clear to auscultation bilaterally Cardio: regular rate and rhythm Extremities: extremities normal, atraumatic, no cyanosis or edema Except for complete amputation of Left Long Finger tip just distal to dipj Assessment: Amputation of LLF tip Plan: Will perform revision amputation I have discussed this treatment plan in detail with patient and family, including the risks of the recommended treatment and surgery, the benefits and the alternatives.  The patient and caregiver understands that additional treatment may be necessary.  Danielle Newton 03/16/2024, 1:50 PM

## 2024-03-16 NOTE — Op Note (Unsigned)
 NAME: Danielle Newton, Danielle Newton MEDICAL RECORD NO: [id number] ACCOUNT NO: [id number] DATE OF BIRTH: January 05, 1971 FACILITY: [facility] LOCATION: [facility] PHYSICIAN: Irfan Veal C. Lorretta, MD  Operative Report   DATE OF PROCEDURE: 03/16/2024  PREOPERATIVE DIAGNOSIS:  Amputation of the left long fingertip.  POSTOPERATIVE DIAGNOSIS: Amputation of the left long fingertip.  PROCEDURE PERFORMED:  Revision amputation of the left long finger with local advancement flap and neurectomies.  ANESTHESIA:  Local with IV sedation.  ESTIMATED BLOOD LOSS:  Minimal.  COMPLICATIONS:  No acute complications.  SPECIMENS:  No specimens.  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  She was wearing gloves and somehow sustained a laceration to her finger.  She presented to the ER with the gloves intact.  When her hand was removed from the  glove, the fingertip was completely amputated from her left long finger.  She was referred to [facility] ER, and I was consulted.  Because of the nature of the wound and the distal aspect, she was told that replantation was not possible at this  facility.  That revision amputation was what I could offer her.  She agreed with this course of action.  Consent was obtained for revision amputation.  DESCRIPTION OF PROCEDURE:  The patient was taken to the operating room and placed supine on the operating room table.  The left upper extremity was prepped and draped in the normal sterile fashion.  A time-out was performed.  Antibiotics were given in  the emergency room and therefore not needed again.  The wound was a fairly clean amputation at the base of the nail plate and just distal to the DIP joint.  The wound was irrigated with saline solution.  A fish mouth type incision was marked, and both  the radial and ulnar side of the digit were fashioned to create a nice tension-free closure.  The small portion of the distal phalanx was removed at the  joint.  Some of the middle phalanx head was rongeured to allow tension-free closure.  The  neurovascular bundle was grasped, pulled, and then transected on each side.  Hemostasis was obtained.  The skin was closed with multiple interrupted 4-0 Prolene sutures for a nice closure.  Lidocaine  plain and plain Marcaine  were infiltrated around the  base of the finger for postoperative pain control.  The patient tolerated the procedure well and was taken to the recovery room stable.   PUS D: 03/16/2024 3:05:35 pm T: 03/16/2024 3:18:00 pm  JOB: [id number]/ [id number]

## 2024-03-16 NOTE — ED Notes (Signed)
 Patient transported to short stay by tech, patient belongings with family, pants still on patient. Patient is aware of plan and provider is waiting for patient in OR.

## 2024-03-16 NOTE — ED Triage Notes (Signed)
 Unable to perform wound care, provider to examine prior.

## 2024-03-16 NOTE — ED Triage Notes (Signed)
 I was trimming tips of bushes and the trimming sheers caught the end of my left middle finger.

## 2024-03-16 NOTE — Transfer of Care (Signed)
 Immediate Anesthesia Transfer of Care Note  Patient: Danielle Newton  Procedure(s) Performed: IRRIGATION AND DEBRIDEMENT WOUND (Left)  Patient Location: PACU  Anesthesia Type:MAC  Level of Consciousness: awake, alert , and oriented  Airway & Oxygen Therapy: Patient Spontanous Breathing  Post-op Assessment: Report given to RN and Post -op Vital signs reviewed and stable  Post vital signs: Reviewed and stable  Last Vitals:  Vitals Value Taken Time  BP 112/82 03/16/24 15:03  Temp 97.9 03/16/24 1505  Pulse 88 03/16/24 15:05  Resp 14 03/16/24 15:05  SpO2 95 % 03/16/24 15:05  Vitals shown include unfiled device data.  Pt denies pain      Complications: No notable events documented.

## 2024-03-16 NOTE — ED Notes (Signed)
 Patient is being discharged from the Urgent Care and sent to the Emergency Department via POV . Per PB, patient is in need of higher level of care due to possible amputation and or open fracture. Patient is aware and verbalizes understanding of plan of care.  Vitals:   03/16/24 1053  BP: (!) 159/98  Pulse: (!) 101  Resp: 20  Temp: 98.2 F (36.8 C)  SpO2: 95%

## 2024-03-16 NOTE — ED Provider Notes (Signed)
 [facility]    CSN: [id number] Arrival date & time: 03/16/24  1037      History   Chief Complaint Chief Complaint  Patient presents with   Laceration    HPI Danielle Newton is a 53 y.o. female.    Laceration  Here for laceration to her left middle finger.  She was using electric trimming shears and caught the end of her left middle finger. It is bleeding a lot and almost loose.  She is allergic to penicillin and benzonatate.    Past Medical History:  Diagnosis Date   Obesity, unspecified    Other and unspecified hyperlipidemia    Unspecified essential hypertension     Patient Active Problem List   Diagnosis Date Noted   Pyelonephritis, acute 08/02/2012   Fever and chills 08/02/2012   Essential hypertension 09/23/2009   HYPERLIPIDEMIA, MILD 04/11/2009   OBESITY 04/11/2009    Past Surgical History:  Procedure Laterality Date   Incision and drainage left breast     VAGINAL DELIVERY  1998, 2000    OB History     Gravida  2   Para  2   Term      Preterm      AB      Living  2      SAB      IAB      Ectopic      Multiple      Live Births               Home Medications      Family History Family History  Problem Relation Age of Onset   Hypertension Father     Social History Social History   Tobacco Use   Smoking status: Never   Smokeless tobacco: Never  Vaping Use   Vaping status: Never Used  Substance Use Topics   Alcohol use: No   Drug use: No     Allergies   Penicillins and Benzonatate   Review of Systems Review of Systems   Physical Exam Triage Vital Signs ED Triage Vitals  Encounter Vitals Group     BP 03/16/24 1053 (!) 159/98     Girls Systolic BP Percentile --      Girls Diastolic BP Percentile --      Boys Systolic BP Percentile --      Boys Diastolic BP Percentile --      Pulse Rate 03/16/24 1053 (!) 101     Resp 03/16/24 1053 20     Temp 03/16/24 1053 98.2 F (36.8 C)      Temp Source 03/16/24 1051 Oral     SpO2 03/16/24 1053 95 %     Weight 03/16/24 1049 207 lb 14.3 oz (94.3 kg)     Height 03/16/24 1049 5' 1 (1.549 m)     Head Circumference --      Peak Flow --      Pain Score 03/16/24 1049 7     Pain Loc --      Pain Education --      Exclude from Growth Chart --    No data found.  Updated Vital Signs BP (!) 159/98 (BP Location: Right Arm)   Pulse (!) 101   Temp 98.2 F (36.8 C) (Oral)   Resp 20   Ht 5' 1 (1.549 m)   Wt 94.3 kg   SpO2 95%   BMI 39.28 kg/m   Visual Acuity Right Eye Distance:   Left  Eye Distance:   Bilateral Distance:    Right Eye Near:   Left Eye Near:    Bilateral Near:     Physical Exam Vitals reviewed.  Constitutional:      General: She is not in acute distress.    Appearance: She is not toxic-appearing.  Musculoskeletal:     Comments: Staff has left the gardening glove on.  The patient has left the gardening glove on because the end of the finger is almost loose.  Staff had wrapped it and we unwrapped the finger.  There is bleeding evident that has soaked through the bandaging that has been applied here.  I think I can see bone and if you pull on the end of the glove at all feels like the end of the finger is going to come off.  Neurological:     Mental Status: She is alert and oriented to person, place, and time.  Psychiatric:        Behavior: Behavior normal.      UC Treatments / Results  Labs (all labs ordered are listed, but only abnormal results are displayed) Labs Reviewed - No data to display  EKG   Radiology No results found.  Procedures Procedures (including critical care time)  Medications Ordered in UC Medications - No data to display  Initial Impression / Assessment and Plan / UC Course  I have reviewed the triage vital signs and the nursing notes.  Pertinent labs & imaging results that were available during my care of the patient were reviewed by me and considered in my medical  decision making (see chart for details).     Because we cannot achieve hemostasis for her here and I think she needs assessment for amputation of the end of her finger or at least bony injury, she is going to go to the emergency room for further assessment and treatment that we cannot provide here in the clinic setting. Final Clinical Impressions(s) / UC Diagnoses   Final diagnoses:  Laceration of left middle finger, foreign body presence unspecified, nail damage status unspecified, initial encounter     Discharge Instructions      The patient will go to the emergency room for further evaluation and treatment    ED Prescriptions   None    PDMP not reviewed this encounter.   Vonna Sharlet POUR, MD 03/16/24 [phone]

## 2024-03-16 NOTE — Anesthesia Preprocedure Evaluation (Addendum)
 Anesthesia Evaluation  Patient identified by MRN, date of birth, ID band Patient awake    Reviewed: Allergy & Precautions, NPO status , Patient's Chart, lab work & pertinent test results  Airway Mallampati: II  TM Distance: >3 FB Neck ROM: Full    Dental no notable dental hx.    Pulmonary neg pulmonary ROS   Pulmonary exam normal        Cardiovascular hypertension, Pt. on medications Normal cardiovascular exam     Neuro/Psych negative neurological ROS  negative psych ROS   GI/Hepatic negative GI ROS, Neg liver ROS,,,  Endo/Other  diabetes, Insulin  Dependent    Renal/GU Renal disease     Musculoskeletal   Abdominal  (+) + obese  Peds  Hematology negative hematology ROS (+)   Anesthesia Other Findings INFECTED LEFT LONG FINGER  Reproductive/Obstetrics                              Anesthesia Physical Anesthesia Plan  ASA: 3  Anesthesia Plan: MAC   Post-op Pain Management:    Induction: Intravenous  PONV Risk Score and Plan: 2 and Ondansetron, Dexamethasone, Propofol  infusion and Treatment may vary due to age or medical condition  Airway Management Planned: Simple Face Mask  Additional Equipment:   Intra-op Plan:   Post-operative Plan:   Informed Consent: I have reviewed the patients History and Physical, chart, labs and discussed the procedure including the risks, benefits and alternatives for the proposed anesthesia with the patient or authorized representative who has indicated his/her understanding and acceptance.     Dental advisory given  Plan Discussed with: CRNA  Anesthesia Plan Comments:         Anesthesia Quick Evaluation

## 2024-03-17 ENCOUNTER — Encounter (HOSPITAL_COMMUNITY): Payer: Self-pay | Admitting: General Surgery
